# Patient Record
Sex: Female | Born: 1970 | Race: White | Hispanic: No | Marital: Married | Smoking: Current every day smoker
Health system: Southern US, Community
[De-identification: ages and names within clinical notes are randomized; demographics above are authoritative.]

## PROBLEM LIST (undated history)

## (undated) DIAGNOSIS — T4145XA Adverse effect of unspecified anesthetic, initial encounter: Secondary | ICD-10-CM

## (undated) DIAGNOSIS — G4730 Sleep apnea, unspecified: Secondary | ICD-10-CM

## (undated) DIAGNOSIS — Z87442 Personal history of urinary calculi: Secondary | ICD-10-CM

## (undated) DIAGNOSIS — F419 Anxiety disorder, unspecified: Secondary | ICD-10-CM

## (undated) DIAGNOSIS — T8859XA Other complications of anesthesia, initial encounter: Secondary | ICD-10-CM

## (undated) DIAGNOSIS — K219 Gastro-esophageal reflux disease without esophagitis: Secondary | ICD-10-CM

## (undated) DIAGNOSIS — F329 Major depressive disorder, single episode, unspecified: Secondary | ICD-10-CM

## (undated) DIAGNOSIS — M1990 Unspecified osteoarthritis, unspecified site: Secondary | ICD-10-CM

## (undated) DIAGNOSIS — F32A Depression, unspecified: Secondary | ICD-10-CM

## (undated) HISTORY — PX: TONSILLECTOMY: SUR1361

---

## 1995-09-10 HISTORY — PX: CHOLECYSTECTOMY: SHX55

## 1999-03-10 HISTORY — PX: TUBAL LIGATION: SHX77

## 2003-03-25 ENCOUNTER — Emergency Department (HOSPITAL_COMMUNITY): Admission: EM | Admit: 2003-03-25 | Discharge: 2003-03-25 | Payer: Self-pay | Admitting: Emergency Medicine

## 2011-06-10 HISTORY — PX: TOTAL VAGINAL HYSTERECTOMY: SHX2548

## 2016-07-19 ENCOUNTER — Encounter: Payer: Self-pay | Admitting: Sports Medicine

## 2016-07-19 ENCOUNTER — Ambulatory Visit (INDEPENDENT_AMBULATORY_CARE_PROVIDER_SITE_OTHER): Payer: BLUE CROSS/BLUE SHIELD | Admitting: Sports Medicine

## 2016-07-19 DIAGNOSIS — F32A Depression, unspecified: Secondary | ICD-10-CM | POA: Insufficient documentation

## 2016-07-19 DIAGNOSIS — L918 Other hypertrophic disorders of the skin: Secondary | ICD-10-CM | POA: Diagnosis not present

## 2016-07-19 DIAGNOSIS — E8940 Asymptomatic postprocedural ovarian failure: Secondary | ICD-10-CM

## 2016-07-19 DIAGNOSIS — Z0000 Encounter for general adult medical examination without abnormal findings: Secondary | ICD-10-CM | POA: Diagnosis not present

## 2016-07-19 DIAGNOSIS — R42 Dizziness and giddiness: Secondary | ICD-10-CM

## 2016-07-19 DIAGNOSIS — F419 Anxiety disorder, unspecified: Secondary | ICD-10-CM

## 2016-07-19 DIAGNOSIS — F418 Other specified anxiety disorders: Secondary | ICD-10-CM | POA: Diagnosis not present

## 2016-07-19 DIAGNOSIS — F329 Major depressive disorder, single episode, unspecified: Secondary | ICD-10-CM | POA: Insufficient documentation

## 2016-07-19 MED ORDER — SERTRALINE HCL 25 MG PO TABS: 25.0000 mg | ORAL_TABLET | Freq: Every day | ORAL | 2 refills | Status: DC

## 2016-07-19 NOTE — Assessment & Plan Note (Signed)
Doesn't needs Pap smears anymore. 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

## 2016-07-19 NOTE — Assessment & Plan Note (Signed)
Ordering routine blood work. 

## 2016-07-19 NOTE — Assessment & Plan Note (Signed)
Starting Zoloft 25, return in one month for a PHQ9 and GAD7.

## 2016-07-19 NOTE — Progress Notes (Signed)
  Subjective:    CC: Establish care.   HPI: This is a pleasant 45 year old female, she is related to the [place].    Mood disorder: For many years has had anxiety and depression, has been on an antidepressant in the past but not prolonged time, has severe depressed mood, difficulty sleeping, poor energy, overeating, difficulty concentrating, moderate anhedonia and mild guilt and psychomotor retardation without suicidal or homicidal ideation, in addition she has severe nervousness, difficulty controlling her worry, worrying about different things, difficulty relaxing, restlessness, irritability, and mild fear of impending doom.  Skin tag: Right axilla, would like this removed.  Dizziness: Gets occasional episodes where she'll have a feeling sensation running up from her legs. Agreeable to discuss this at a future visit.  Hot flashes: Is post total hysterectomy, performed for endometriosis, she does get significant and frequent vasomotor instability and hot flashes.  Past medical history:  Negative.  See flowsheet/record as well for more information.  Surgical history: Negative.  See flowsheet/record as well for more information.  Family history: Negative.  See flowsheet/record as well for more information.  Social history: Negative.  See flowsheet/record as well for more information.  Allergies, and medications have been entered into the medical record, reviewed, and no changes needed.    Review of Systems: No headache, visual changes, nausea, vomiting, diarrhea, constipation, dizziness, abdominal pain, skin rash, fevers, chills, night sweats, swollen lymph nodes, weight loss, chest pain, body aches, joint swelling, muscle aches, shortness of breath, mood changes, visual or auditory hallucinations.  Objective:    General: Well Developed, well nourished, and in no acute distress.  Neuro: Alert and oriented x3, extra-ocular muscles intact, sensation grossly intact. Cranial nerves II through  XII are intact, motor, sensory, and coordinative functions are all intact. HEENT: Normocephalic, atraumatic, pupils equal round reactive to light, neck supple, no masses, no lymphadenopathy, thyroid nonpalpable. Oropharynx, nasopharynx, external ear canals are unremarkable. Skin: Warm and dry, no rashes noted. Large skin tag in the right axilla Cardiac: Regular rate and rhythm, no murmurs rubs or gallops.  Respiratory: Clear to auscultation bilaterally. Not using accessory muscles, speaking in full sentences.  Abdominal: Soft, nontender, nondistended, positive bowel sounds, no masses, no organomegaly.  Musculoskeletal: Shoulder, elbow, wrist, hip, knee, ankle stable, and with full range of motion.  Procedure:  Removal of right axillary skin tag(s). Risks, benefits, alternatives explained to patient. Consent obtained. Time out conducted. Noted no overlying induration or erythema at site of injection. A small amount of lidocaine with epinephrine infiltrated under the skin tag(s) for local anesthesia. Hemostat used to clamp the neck of the skin tag(s). Scalpel then used to excise the skin tag(s), and subsequent electrocautery with a Hyfrecator used to control minor bleeding. Antibiotic ointment applied. Wound dressed. Advised to return if increased redness, swelling, drainage, fevers, or chills.  Impression and Recommendations:    The patient was counselled, risk factors were discussed, anticipatory guidance given.  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  Annual physical exam Ordering routine blood work.  Skin tag Surgical excision as above.  Anxiety and depression Starting Zoloft 25, return in one month for a PHQ9 and GAD7.  Dizziness, nonspecific We will discuss this in further detail at follow-up visit

## 2016-07-19 NOTE — Assessment & Plan Note (Signed)
We will discuss this in further detail at follow-up visit

## 2016-07-19 NOTE — Assessment & Plan Note (Signed)
Surgical excision as above.

## 2016-07-27 LAB — CBC
HCT: 43.9 % (ref 35.0–45.0)
Hemoglobin: 14.4 g/dL (ref 11.7–15.5)
MCH: 30.2 pg (ref 27.0–33.0)
MCHC: 32.8 g/dL (ref 32.0–36.0)
MCV: 92 fL (ref 80.0–100.0)
MPV: 10.1 fL (ref 7.5–12.5)
Platelets: 304 K/uL (ref 140–400)
RBC: 4.77 MIL/uL (ref 3.80–5.10)
RDW: 14 % (ref 11.0–15.0)
WBC: 10.3 K/uL (ref 3.8–10.8)

## 2016-07-27 LAB — COMPREHENSIVE METABOLIC PANEL
ALT: 15 U/L (ref 6–29)
Albumin: 4.2 g/dL (ref 3.6–5.1)
Alkaline Phosphatase: 48 U/L (ref 33–115)
BUN: 14 mg/dL (ref 7–25)
Chloride: 107 mmol/L (ref 98–110)
Creat: 0.78 mg/dL (ref 0.50–1.10)
Potassium: 4.4 mmol/L (ref 3.5–5.3)
Total Protein: 6.6 g/dL (ref 6.1–8.1)

## 2016-07-27 LAB — COMPREHENSIVE METABOLIC PANEL WITH GFR
AST: 14 U/L (ref 10–30)
CO2: 27 mmol/L (ref 20–31)
Calcium: 9.5 mg/dL (ref 8.6–10.2)
Glucose, Bld: 94 mg/dL (ref 65–99)
Sodium: 141 mmol/L (ref 135–146)
Total Bilirubin: 0.3 mg/dL (ref 0.2–1.2)

## 2016-07-27 LAB — LIPID PANEL
Cholesterol: 173 mg/dL (ref <200)
HDL: 37 mg/dL — ABNORMAL LOW (ref >50)
LDL Cholesterol: 114 mg/dL — ABNORMAL HIGH (ref <100)
Total CHOL/HDL Ratio: 4.7 Ratio (ref <5.0)
Triglycerides: 108 mg/dL (ref <150)
VLDL: 22 mg/dL (ref <30)

## 2016-07-27 LAB — HIV ANTIBODY (ROUTINE TESTING W REFLEX): HIV 1&2 Ab, 4th Generation: NONREACTIVE

## 2016-07-27 LAB — HEMOGLOBIN A1C
Hgb A1c MFr Bld: 5.2 % (ref <5.7)
Mean Plasma Glucose: 103 mg/dL

## 2016-07-27 LAB — TSH: TSH: 2.79 m[IU]/L

## 2016-07-27 LAB — VITAMIN D 25 HYDROXY (VIT D DEFICIENCY, FRACTURES): Vit D, 25-Hydroxy: 18 ng/mL — ABNORMAL LOW (ref 30–100)

## 2016-07-27 MED ORDER — VITAMIN D (ERGOCALCIFEROL) 1.25 MG (50000 UNIT) PO CAPS: 50000.0000 [IU] | ORAL_CAPSULE | ORAL | 0 refills | Status: DC

## 2016-07-27 NOTE — Addendum Note (Signed)
Addended by: Monica BectonHEKKEKANDAM, THOMAS J on: 07/27/2016 11:19 AM   Modules accepted: Orders

## 2016-08-16 ENCOUNTER — Ambulatory Visit (INDEPENDENT_AMBULATORY_CARE_PROVIDER_SITE_OTHER): Payer: BLUE CROSS/BLUE SHIELD | Admitting: Sports Medicine

## 2016-08-16 DIAGNOSIS — E6609 Other obesity due to excess calories: Secondary | ICD-10-CM

## 2016-08-16 DIAGNOSIS — F32A Depression, unspecified: Secondary | ICD-10-CM

## 2016-08-16 DIAGNOSIS — F418 Other specified anxiety disorders: Secondary | ICD-10-CM | POA: Diagnosis not present

## 2016-08-16 DIAGNOSIS — F419 Anxiety disorder, unspecified: Principal | ICD-10-CM

## 2016-08-16 DIAGNOSIS — F329 Major depressive disorder, single episode, unspecified: Secondary | ICD-10-CM

## 2016-08-16 DIAGNOSIS — R002 Palpitations: Secondary | ICD-10-CM | POA: Diagnosis not present

## 2016-08-16 DIAGNOSIS — E669 Obesity, unspecified: Secondary | ICD-10-CM | POA: Insufficient documentation

## 2016-08-16 MED ORDER — CITALOPRAM HYDROBROMIDE 10 MG PO TABS: 10.0000 mg | ORAL_TABLET | Freq: Every day | ORAL | 3 refills | Status: DC

## 2016-08-16 NOTE — Assessment & Plan Note (Signed)
Likely due to anxiety but we are going to proceed with Holter monitoring to rule out arrhythmia.

## 2016-08-16 NOTE — Progress Notes (Signed)
  Subjective:    CC: Follow-up  HPI: This is a pleasant 46 year old female, she comes in for follow-up of her anxiety and depression, has had some difficulty with tolerating 25 mg of Zoloft. Still has severe depressive and anxiety symptoms.  Obesity: Would like help losing weight.  Palpitations: Tells me that the palpitations occur first and then she becomes anxious, no chest pain, no presyncope. Has never had this worked up.  Past medical history:  Negative.  See flowsheet/record as well for more information.  Surgical history: Negative.  See flowsheet/record as well for more information.  Family history: Negative.  See flowsheet/record as well for more information.  Social history: Negative.  See flowsheet/record as well for more information.  Allergies, and medications have been entered into the medical record, reviewed, and no changes needed.   Review of Systems: No fevers, chills, night sweats, weight loss, chest pain, or shortness of breath.   Objective:    General: Well Developed, well nourished, and in no acute distress.  Neuro: Alert and oriented x3, extra-ocular muscles intact, sensation grossly intact.  HEENT: Normocephalic, atraumatic, pupils equal round reactive to light, neck supple, no masses, no lymphadenopathy, thyroid nonpalpable.  Skin: Warm and dry, no rashes. Cardiac: Regular rate and rhythm, no murmurs rubs or gallops, no lower extremity edema.  Respiratory: Clear to auscultation bilaterally. Not using accessory muscles, speaking in full sentences.  Impression and Recommendations:    Anxiety and depression Intolerant to Zoloft, switching to Celexa 10. Return in one month.  Palpitations Likely due to anxiety but we are going to proceed with Holter monitoring to rule out arrhythmia.  Obesity Patient will research weight loss medications and I'll prescribe whatever she wants.  I spent 25 minutes with this patient, greater than 50% was face-to-face time  counseling regarding the above diagnoses

## 2016-08-16 NOTE — Assessment & Plan Note (Signed)
Intolerant to Zoloft, switching to Celexa 10. Return in one month.

## 2016-08-16 NOTE — Assessment & Plan Note (Signed)
Patient will research weight loss medications and I'll prescribe whatever she wants.

## 2016-08-17 ENCOUNTER — Other Ambulatory Visit: Payer: Self-pay | Admitting: Sports Medicine

## 2016-08-17 DIAGNOSIS — R002 Palpitations: Secondary | ICD-10-CM

## 2016-08-23 ENCOUNTER — Ambulatory Visit (INDEPENDENT_AMBULATORY_CARE_PROVIDER_SITE_OTHER): Payer: BLUE CROSS/BLUE SHIELD

## 2016-08-23 DIAGNOSIS — R002 Palpitations: Secondary | ICD-10-CM | POA: Diagnosis not present

## 2016-09-13 ENCOUNTER — Encounter: Payer: Self-pay | Admitting: Sports Medicine

## 2016-09-13 ENCOUNTER — Ambulatory Visit (INDEPENDENT_AMBULATORY_CARE_PROVIDER_SITE_OTHER): Payer: BLUE CROSS/BLUE SHIELD | Admitting: Sports Medicine

## 2016-09-13 DIAGNOSIS — F418 Other specified anxiety disorders: Secondary | ICD-10-CM | POA: Diagnosis not present

## 2016-09-13 DIAGNOSIS — E6609 Other obesity due to excess calories: Secondary | ICD-10-CM | POA: Diagnosis not present

## 2016-09-13 DIAGNOSIS — F32A Depression, unspecified: Secondary | ICD-10-CM

## 2016-09-13 DIAGNOSIS — R002 Palpitations: Secondary | ICD-10-CM

## 2016-09-13 DIAGNOSIS — F419 Anxiety disorder, unspecified: Secondary | ICD-10-CM

## 2016-09-13 DIAGNOSIS — F329 Major depressive disorder, single episode, unspecified: Secondary | ICD-10-CM

## 2016-09-13 MED ORDER — PHENTERMINE HCL 37.5 MG PO TABS: ORAL_TABLET | ORAL | 0 refills | Status: DC

## 2016-09-13 MED ORDER — DOCUSATE SODIUM 100 MG PO CAPS: 100.0000 mg | ORAL_CAPSULE | Freq: Two times a day (BID) | ORAL | 3 refills | Status: DC

## 2016-09-13 MED ORDER — CITALOPRAM HYDROBROMIDE 20 MG PO TABS: 20.0000 mg | ORAL_TABLET | Freq: Every day | ORAL | 3 refills | Status: DC

## 2016-09-13 NOTE — Assessment & Plan Note (Signed)
Holter negative, rare PACs and PVCs that were asymptomatic.

## 2016-09-13 NOTE — Progress Notes (Signed)
  Subjective:    CC: Follow-up  HPI: Depression and anxiety: Good tolerance and good improvement with Celexa 10, only now with moderate poor energy, overeating, mild difficulty sleeping and difficulty concentrating. No suicidal or homicidal ideation. Still has severe nervousness, moderate or anybody different things, trouble relaxing, irritability and mild restlessness and difficulty controlling her worry.  Palpitations: Finished Holter monitoring, she did have a few PACs and PVCs that were asymptomatic.  Obesity: Desires to restart weight medication now that Holter monitor test is negative.  Past medical history:  Negative.  See flowsheet/record as well for more information.  Surgical history: Negative.  See flowsheet/record as well for more information.  Family history: Negative.  See flowsheet/record as well for more information.  Social history: Negative.  See flowsheet/record as well for more information.  Allergies, and medications have been entered into the medical record, reviewed, and no changes needed.   Review of Systems: No fevers, chills, night sweats, weight loss, chest pain, or shortness of breath.   Objective:    General: Well Developed, well nourished, and in no acute distress.  Neuro: Alert and oriented x3, extra-ocular muscles intact, sensation grossly intact.  HEENT: Normocephalic, atraumatic, pupils equal round reactive to light, neck supple, no masses, no lymphadenopathy, thyroid nonpalpable.  Skin: Warm and dry, no rashes. Cardiac: Regular rate and rhythm, no murmurs rubs or gallops, no lower extremity edema.  Respiratory: Clear to auscultation bilaterally. Not using accessory muscles, speaking in full sentences.  Impression and Recommendations:    Anxiety and depression Improvement with Celexa 10. Increasing to 20mg  . Return in 1 month for PHQ9 and GAD7  Obesity Restarting phentermine. Normal holter monitor. Colace to prevent  constipation  Palpitations Holter negative, rare PACs and PVCs that were asymptomatic.

## 2016-09-13 NOTE — Assessment & Plan Note (Signed)
Restarting phentermine. Normal holter monitor. Colace to prevent constipation

## 2016-09-13 NOTE — Assessment & Plan Note (Signed)
Improvement with Celexa 10. Increasing to 20mg  . Return in 1 month for PHQ9 and GAD7

## 2016-09-16 ENCOUNTER — Other Ambulatory Visit: Payer: Self-pay | Admitting: Sports Medicine

## 2016-09-17 ENCOUNTER — Telehealth: Payer: Self-pay

## 2016-09-17 NOTE — Telephone Encounter (Signed)
Pt states multiple people in the home have been diagnosed with flu and would like to know if they can get prophylactic treatment. Please assist.

## 2016-09-17 NOTE — Telephone Encounter (Signed)
Decided to wait on Tamiflu.

## 2016-09-17 NOTE — Telephone Encounter (Signed)
Yes they can.  But I would need names and DOBs and pharmacy of choice.

## 2016-10-11 ENCOUNTER — Ambulatory Visit (INDEPENDENT_AMBULATORY_CARE_PROVIDER_SITE_OTHER): Payer: BLUE CROSS/BLUE SHIELD | Admitting: Sports Medicine

## 2016-10-11 DIAGNOSIS — F32A Depression, unspecified: Secondary | ICD-10-CM

## 2016-10-11 DIAGNOSIS — F329 Major depressive disorder, single episode, unspecified: Secondary | ICD-10-CM

## 2016-10-11 DIAGNOSIS — E6609 Other obesity due to excess calories: Secondary | ICD-10-CM

## 2016-10-11 DIAGNOSIS — F418 Other specified anxiety disorders: Secondary | ICD-10-CM | POA: Diagnosis not present

## 2016-10-11 DIAGNOSIS — F419 Anxiety disorder, unspecified: Principal | ICD-10-CM

## 2016-10-11 DIAGNOSIS — E8940 Asymptomatic postprocedural ovarian failure: Secondary | ICD-10-CM | POA: Diagnosis not present

## 2016-10-11 MED ORDER — CITALOPRAM HYDROBROMIDE 40 MG PO TABS: 40.0000 mg | ORAL_TABLET | Freq: Every day | ORAL | 3 refills | Status: DC

## 2016-10-11 MED ORDER — PHENTERMINE HCL 37.5 MG PO TABS: ORAL_TABLET | ORAL | 0 refills | Status: DC

## 2016-10-11 NOTE — Assessment & Plan Note (Signed)
10 pound weight loss after the first month on phentermine, refilling medication. Return in one month

## 2016-10-11 NOTE — Assessment & Plan Note (Signed)
Continued fantastic improvements in mood with increasing to 20 Celexa. Continues to have hot flashes so I am going to increase to 40 mg of Celexa.

## 2016-10-11 NOTE — Progress Notes (Signed)
  Subjective:    CC: Follow-up  HPI: Depression and anxiety: Continues to improve with Celexa 20 mg. No suicidal or homicidal ideation  Obesity: 10 pound weight loss after the first month on phentermine.  Vasomotor instability: Postmenopausal, has not noticed much improvement yet going to 20 mg of Celexa, agreeable to go up on the dose before we consider hormone replacement.  Past medical history:  Negative.  See flowsheet/record as well for more information.  Surgical history: Negative.  See flowsheet/record as well for more information.  Family history: Negative.  See flowsheet/record as well for more information.  Social history: Negative.  See flowsheet/record as well for more information.  Allergies, and medications have been entered into the medical record, reviewed, and no changes needed.   Review of Systems: No fevers, chills, night sweats, weight loss, chest pain, or shortness of breath.   Objective:    General: Well Developed, well nourished, and in no acute distress.  Neuro: Alert and oriented x3, extra-ocular muscles intact, sensation grossly intact.  HEENT: Normocephalic, atraumatic, pupils equal round reactive to light, neck supple, no masses, no lymphadenopathy, thyroid nonpalpable.  Skin: Warm and dry, no rashes. Cardiac: Regular rate and rhythm, no murmurs rubs or gallops, no lower extremity edema.  Respiratory: Clear to auscultation bilaterally. Not using accessory muscles, speaking in full sentences.  Impression and Recommendations:    Anxiety and depression Continued fantastic improvements in mood with increasing to 20 Celexa. Continues to have hot flashes so I am going to increase to 40 mg of Celexa.  Obesity 10 pound weight loss after the first month on phentermine, refilling medication. Return in one month  Surgical menopause Still having severe hot flashes, we are going to increase Celexa to 40 mg before considering hormone replacement therapy. Patient  is post hysterectomy.  I spent 25 minutes with this patient, greater than 50% was face-to-face time counseling regarding the above diagnoses

## 2016-10-11 NOTE — Assessment & Plan Note (Signed)
Still having severe hot flashes, we are going to increase Celexa to 40 mg before considering hormone replacement therapy. Patient is post hysterectomy.

## 2016-10-21 ENCOUNTER — Telehealth: Payer: Self-pay | Admitting: Sports Medicine

## 2016-10-21 MED ORDER — AMOXICILLIN-POT CLAVULANATE 875-125 MG PO TABS: 1.0000 | ORAL_TABLET | Freq: Two times a day (BID) | ORAL | 0 refills | Status: AC

## 2016-10-21 NOTE — Telephone Encounter (Signed)
Pt advised.

## 2016-10-21 NOTE — Telephone Encounter (Signed)
Augmentin sent in.  

## 2016-10-21 NOTE — Telephone Encounter (Signed)
Pt called clinic requesting an Rx for an antibiotic and pain. Pt states she is on vacation in [place] and has an abscessed tooth. Request something to help her until she gets back into town and can be seen.   Closest pharmacy: [facility] [address], [place], [place] [id number]

## 2016-11-01 ENCOUNTER — Encounter: Payer: Self-pay | Admitting: Sports Medicine

## 2016-11-08 ENCOUNTER — Ambulatory Visit (INDEPENDENT_AMBULATORY_CARE_PROVIDER_SITE_OTHER): Payer: BLUE CROSS/BLUE SHIELD | Admitting: Sports Medicine

## 2016-11-08 ENCOUNTER — Encounter: Payer: Self-pay | Admitting: Sports Medicine

## 2016-11-08 DIAGNOSIS — F17200 Nicotine dependence, unspecified, uncomplicated: Secondary | ICD-10-CM

## 2016-11-08 DIAGNOSIS — E6609 Other obesity due to excess calories: Secondary | ICD-10-CM

## 2016-11-08 MED ORDER — PHENTERMINE HCL 37.5 MG PO TABS: ORAL_TABLET | ORAL | 0 refills | Status: DC

## 2016-11-08 MED ORDER — VARENICLINE TARTRATE 1 MG PO TABS: 1.0000 mg | ORAL_TABLET | Freq: Two times a day (BID) | ORAL | 3 refills | Status: DC

## 2016-11-08 MED ORDER — VARENICLINE TARTRATE 0.5 MG X 11 & 1 MG X 42 PO MISC: ORAL | 0 refills | Status: DC

## 2016-11-08 NOTE — Progress Notes (Signed)
  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 She has been on Colace and this is improved. Would like to try an additional month on phentermine before considering bariatric surgery.  Smoker: Would like to start Chantix.  Past medical history:  Negative.  See flowsheet/record as well for more information.  Surgical history: Negative.  See flowsheet/record as well for more information.  Family history: Negative.  See flowsheet/record as well for more information.  Social history: Negative.  See flowsheet/record as well for more information.  Allergies, and medications have been entered into the medical record, reviewed, and no changes needed.   Review of Systems: No fevers, chills, night sweats, weight loss, chest pain, or shortness of breath.   Objective:    General: Well Developed, well nourished, and in no acute distress.  Neuro: Alert and oriented x3, extra-ocular muscles intact, sensation grossly intact.  HEENT: Normocephalic, atraumatic, pupils equal round reactive to light, neck supple, no masses, no lymphadenopathy, thyroid nonpalpable.  Skin: Warm and dry, no rashes. Cardiac: Regular rate and rhythm, no murmurs rubs or gallops, no lower extremity edema.  Respiratory: Clear to auscultation bilaterally. Not using accessory muscles, speaking in full sentences.  Impression and Recommendations:    Obesity Stopped medications due to an anal fissure, discussed using Colace to keep stool soft, I'm going to refill the medication as we enter the third month, but she understands if she does not respond this month then we will proceed with bariatric surgery.  Smoker Starting Chantix  I spent 25 minutes with this patient, greater than 50% was face-to-face time counseling regarding the above diagnoses

## 2016-11-08 NOTE — Assessment & Plan Note (Signed)
Stopped medications due to an anal fissure, discussed using Colace to keep stool soft, I'm going to refill the medication as we enter the third month, but she understands if she does not respond this month then we will proceed with bariatric surgery.

## 2016-11-08 NOTE — Assessment & Plan Note (Signed)
Starting Chantix. 

## 2016-11-10 ENCOUNTER — Other Ambulatory Visit: Payer: Self-pay | Admitting: Sports Medicine

## 2016-11-10 DIAGNOSIS — F419 Anxiety disorder, unspecified: Principal | ICD-10-CM

## 2016-11-10 DIAGNOSIS — F32A Depression, unspecified: Secondary | ICD-10-CM

## 2016-11-10 DIAGNOSIS — F329 Major depressive disorder, single episode, unspecified: Secondary | ICD-10-CM

## 2016-11-11 ENCOUNTER — Encounter: Payer: Self-pay | Admitting: Sports Medicine

## 2016-11-15 ENCOUNTER — Encounter: Payer: Self-pay | Admitting: Sports Medicine

## 2016-11-17 ENCOUNTER — Encounter: Payer: Self-pay | Admitting: Sports Medicine

## 2016-11-29 ENCOUNTER — Other Ambulatory Visit (HOSPITAL_COMMUNITY): Payer: Self-pay | Admitting: Surgery

## 2016-12-06 ENCOUNTER — Ambulatory Visit (INDEPENDENT_AMBULATORY_CARE_PROVIDER_SITE_OTHER): Payer: BLUE CROSS/BLUE SHIELD | Admitting: Sports Medicine

## 2016-12-06 ENCOUNTER — Encounter: Payer: Self-pay | Admitting: Sports Medicine

## 2016-12-06 DIAGNOSIS — L03039 Cellulitis of unspecified toe: Secondary | ICD-10-CM | POA: Diagnosis not present

## 2016-12-06 DIAGNOSIS — E6609 Other obesity due to excess calories: Secondary | ICD-10-CM | POA: Diagnosis not present

## 2016-12-06 MED ORDER — DOXYCYCLINE HYCLATE 100 MG PO TABS: 100.0000 mg | ORAL_TABLET | Freq: Two times a day (BID) | ORAL | 0 refills | Status: AC

## 2016-12-06 MED ORDER — TERBINAFINE HCL 250 MG PO TABS: 250.0000 mg | ORAL_TABLET | Freq: Every day | ORAL | 1 refills | Status: AC

## 2016-12-06 NOTE — Assessment & Plan Note (Signed)
Could not tolerate phentermine due to anal fissure, she is going to proceed with bariatric surgery.

## 2016-12-06 NOTE — Progress Notes (Signed)
  Subjective:    CC: Follow-up  HPI: Obesity: She is now set up with bariatric surgery. Continues to not be able to tolerate phentermine due to anal fissures.  Toe pain: Left great toe, history of ingrown toenails and thickening of the nails. Has never been treated for onychomycosis. Pain is moderate, persistent, localized without radiation.  Past medical history:  Negative.  See flowsheet/record as well for more information.  Surgical history: Negative.  See flowsheet/record as well for more information.  Family history: Negative.  See flowsheet/record as well for more information.  Social history: Negative.  See flowsheet/record as well for more information.  Allergies, and medications have been entered into the medical record, reviewed, and no changes needed.   Review of Systems: No fevers, chills, night sweats, weight loss, chest pain, or shortness of breath.   Objective:    General: Well Developed, well nourished, and in no acute distress.  Neuro: Alert and oriented x3, extra-ocular muscles intact, sensation grossly intact.  HEENT: Normocephalic, atraumatic, pupils equal round reactive to light, neck supple, no masses, no lymphadenopathy, thyroid nonpalpable.  Skin: Warm and dry, no rashes. Cardiac: Regular rate and rhythm, no murmurs rubs or gallops, no lower extremity edema.  Respiratory: Clear to auscultation bilaterally. Not using accessory muscles, speaking in full sentences. Left foot: Visibly erythematous and swollen great toe around the nail with onychodystrophy and yellowing. No visible ingrown toenail. Classic paronychia.  Impression and Recommendations:    Obesity Could not tolerate phentermine due to anal fissure, she is going to proceed with bariatric surgery.  Paronychia of toe Left and right great toenails, with what appears to be onychomycosis with onychodystrophy. Adding doxycycline, Lamisil. Return in one month for this. I told her we will try to get this  under control with months of Lamisil treatment before considering nail plate excision.  I spent 25 minutes with this patient, greater than 50% was face-to-face time counseling regarding the above diagnoses

## 2016-12-06 NOTE — Assessment & Plan Note (Signed)
Left and right great toenails, with what appears to be onychomycosis with onychodystrophy. Adding doxycycline, Lamisil. Return in one month for this. I told her we will try to get this under control with months of Lamisil treatment before considering nail plate excision.

## 2016-12-09 ENCOUNTER — Other Ambulatory Visit: Payer: Self-pay | Admitting: Sports Medicine

## 2016-12-09 ENCOUNTER — Encounter: Payer: Self-pay | Admitting: Sports Medicine

## 2016-12-09 DIAGNOSIS — F329 Major depressive disorder, single episode, unspecified: Secondary | ICD-10-CM

## 2016-12-09 DIAGNOSIS — F419 Anxiety disorder, unspecified: Principal | ICD-10-CM

## 2016-12-13 ENCOUNTER — Ambulatory Visit (HOSPITAL_COMMUNITY)
Admission: RE | Admit: 2016-12-13 | Discharge: 2016-12-13 | Disposition: A | Discharge status: Home / Self Care | Payer: BLUE CROSS/BLUE SHIELD | Source: Ambulatory Visit | Attending: Surgery | Admitting: Surgery

## 2016-12-13 ENCOUNTER — Encounter (HOSPITAL_COMMUNITY): Payer: Self-pay | Admitting: Radiology

## 2016-12-13 ENCOUNTER — Other Ambulatory Visit: Payer: Self-pay

## 2016-12-13 DIAGNOSIS — Z6837 Body mass index (BMI) 37.0-37.9, adult: Secondary | ICD-10-CM | POA: Diagnosis not present

## 2016-12-13 DIAGNOSIS — R9431 Abnormal electrocardiogram [ECG] [EKG]: Secondary | ICD-10-CM | POA: Insufficient documentation

## 2016-12-20 ENCOUNTER — Encounter: Payer: BLUE CROSS/BLUE SHIELD | Attending: Surgery | Admitting: Registered"

## 2016-12-20 ENCOUNTER — Encounter: Payer: Self-pay | Admitting: Registered"

## 2016-12-20 DIAGNOSIS — Z713 Dietary counseling and surveillance: Secondary | ICD-10-CM | POA: Insufficient documentation

## 2016-12-20 DIAGNOSIS — E669 Obesity, unspecified: Secondary | ICD-10-CM

## 2016-12-20 NOTE — Progress Notes (Signed)
Pre-Op Assessment Visit:  Pre-Operative Sleeve Gastrectomy Surgery  Medical Nutrition Therapy:  Appt start time: 11:19  End time:  12:00  Patient was seen on 12/20/2016 for Pre-Operative Nutrition Assessment. Assessment and letter of approval faxed to [facility] Bariatric [facility] Program coordinator on 12/20/2016.   Pt expectation of surgery: be healthy, improve aching joints, sleep apea  Pt expectation of Dietitian: learn how to eat better  Start weight at [facility]: 209.6 BMI: 38.65   Pt states she has gained 3 lbs from last dr visit and attributes it to recently quitting 20+ year habit of smoking.  Pt reports she drinks 3 cans of diet mountain dew a day and thinks it is her security blanket. Pt states she is willing to reduce consumption prior to next visit.   Pt states she needs 6 months of SWL visits with [place] prior to surgery.    24 hr Dietary Recall: First Meal: sausage biscuit Snack: nabs Second Meal: lean cuisine or sandwich Snack: sometimes fruit Third Meal: pork/chicken, green beans, corn, salad, brown rice, mac and cheese Snack: none Beverages: diet mountain dew, water (sometimes)  Encouraged to engage in 150 minutes of moderate physical activity including cardiovascular and weight baring weekly  Handouts given during visit include:  . Pre-Op Goals . Bariatric Surgery Protein Shakes . Vitamin and Mineral Recommendations   During the appointment today the following Pre-Op Goals were reviewed with the patient: . Maintain or lose weight as instructed by your surgeon . Make healthy food choices . Begin to limit portion sizes . Limited concentrated sugars and fried foods . Keep fat/sugar in the single digits per serving on          food labels . Practice CHEWING your food  (aim for 30 chews per bite or until applesauce consistency) . Practice not drinking 15 minutes before, during, and 30 minutes after each meal/snack . Avoid all carbonated beverages   . Avoid/limit caffeinated beverages  . Avoid all sugar-sweetened beverages . Consume 3 meals per day; eat every 3-5 hours . Make a list of non-food related activities . Aim for 64-100 ounces of FLUID daily  . Aim for at least 60-80 grams of PROTEIN daily . Look for a liquid protein source that contain ?15 g protein and ?5 g carbohydrate  (ex: shakes, drinks, shots)  -Follow diet recommendations listed below   Energy and Macronutrient Recomendations: Calories: 1600 Carbohydrate: 180 Protein: 120 Fat: 44  Demonstrated degree of understanding via:  Teach Back   Teaching Method Utilized:  Visual Auditory Hands on  Barriers to learning/adherence to lifestyle change: none  Patient to call the [facility] to enroll in Pre-Op and Post-Op Nutrition Education when surgery date is scheduled.

## 2016-12-29 ENCOUNTER — Encounter: Payer: Self-pay | Admitting: Sports Medicine

## 2016-12-29 MED ORDER — SULFAMETHOXAZOLE-TRIMETHOPRIM 800-160 MG PO TABS: 1.0000 | ORAL_TABLET | Freq: Two times a day (BID) | ORAL | 0 refills | Status: DC

## 2016-12-30 ENCOUNTER — Other Ambulatory Visit: Payer: Self-pay

## 2016-12-30 ENCOUNTER — Encounter: Payer: Self-pay | Admitting: Sports Medicine

## 2016-12-30 MED ORDER — SULFAMETHOXAZOLE-TRIMETHOPRIM 800-160 MG PO TABS: 1.0000 | ORAL_TABLET | Freq: Two times a day (BID) | ORAL | 0 refills | Status: DC

## 2016-12-30 NOTE — Telephone Encounter (Signed)
Spoke with Pt, Rx was sent to incorrect pharmacy. This has been corrected. No further questions.

## 2017-01-04 ENCOUNTER — Ambulatory Visit: Payer: BLUE CROSS/BLUE SHIELD | Admitting: Sports Medicine

## 2017-01-06 ENCOUNTER — Encounter: Payer: Self-pay | Admitting: Sports Medicine

## 2017-01-06 NOTE — Telephone Encounter (Signed)
Called [facility] and called Pt. Rx will be ready for pick up today.

## 2017-01-10 ENCOUNTER — Encounter: Payer: BLUE CROSS/BLUE SHIELD | Attending: Surgery | Admitting: Skilled Nursing Facility1

## 2017-01-10 DIAGNOSIS — E6609 Other obesity due to excess calories: Secondary | ICD-10-CM

## 2017-01-10 DIAGNOSIS — Z713 Dietary counseling and surveillance: Secondary | ICD-10-CM | POA: Insufficient documentation

## 2017-01-11 ENCOUNTER — Encounter: Payer: Self-pay | Admitting: Skilled Nursing Facility1

## 2017-01-11 NOTE — Progress Notes (Signed)
  Pre-Operative Nutrition Class:  Appt start time: 5909   End time:  1830.  Patient was seen on 01/11/2017 for Pre-Operative Bariatric Surgery Education at [facility].   Surgery date:  Surgery type:  Start weight at [facility]: 209.6 Weight today: 214.7  TANITA  BODY COMP RESULTS     BMI (kg/m^2)    Fat Mass (lbs)    Fat Free Mass (lbs)    Total Body Water (lbs)    Samples given per MNT protocol. Patient educated on appropriate usage: Opurity Multivitamin Lot # [id number] Exp: 04/19  celebrate Calcium Citrate Lot # [id number] Exp: 11/2017  premierProtein shake Lot # [id number] Exp: 27/nov/2018  The following the learning objectives were met by the patient during this course:  Identify Pre-Op Dietary Goals and will begin 2 weeks pre-operatively  Identify appropriate sources of fluids and proteins   State protein recommendations and appropriate sources pre and post-operatively  Identify Post-Operative Dietary Goals and will follow for 2 weeks post-operatively  Identify appropriate multivitamin and calcium sources  Describe the need for physical activity post-operatively and will follow MD recommendations  State when to call healthcare provider regarding medication questions or post-operative complications  Handouts given during class include:  Pre-Op Bariatric Surgery Diet Handout  Protein Shake Handout  Post-Op Bariatric Surgery Nutrition Handout  [facility] Program Information Flyer  Support Group Information Flyer  [facility] Bariatric Supplements Price List  Follow-Up Plan: Patient will follow-up at [facility] 2 weeks post operatively for diet advancement per MD.

## 2017-01-17 ENCOUNTER — Ambulatory Visit: Payer: BLUE CROSS/BLUE SHIELD | Admitting: Registered"

## 2017-01-26 NOTE — Progress Notes (Signed)
Please place orders in EPIC as patient is being scheduled for a pre-op appointment! Thank you! 

## 2017-01-27 NOTE — Patient Instructions (Addendum)
Haynes BastJennifer Elaine Weseman  01/31/2017   Your procedure is scheduled on: 02-08-17  Report to [facility]  [facility]  [facility]vators to 3rd floor to  Short Stay Center at 8:45AM.    Call this number if you have problems the morning of surgery [phone]   Remember: ONLY 1 PERSON MAY GO WITH YOU TO SHORT STAY TO GET  READY MORNING OF YOUR SURGERY.  Do not eat food or drink liquids :After Midnight.     Take these medicines the morning of surgery with A SIP OF WATER: citalopram(celexa)                                 You may not have any metal on your body including hair pins and              piercings  Do not wear jewelry, make-up, lotions, powders or perfumes, deodorant             Do not wear nail polish.  Do not shave  48 hours prior to surgery.                 Do not bring valuables to the hospital. [facility] IS NOT             RESPONSIBLE   FOR VALUABLES.  Contacts, dentures or bridgework may not be worn into surgery.  Leave suitcase in the car. After surgery it may be brought to your room.               Please read over the following fact sheets you were given: _____________________________________________________________________        [facility] Pre[facility] Before surgery, you can play an important role.  Because skin is not sterile, your skin needs to be as free of germs as possible.  You can reduce the number of germs on your skin by washing with CHG (chlorahexidine gluconate) soap before surgery.  CHG is an antiseptic cleaner which kills germs and bonds with the skin to continue killing germs even after washing. Please DO NOT use if you have an allergy to CHG or antibacterial soaps.  If your skin becomes reddened/irritated stop using the CHG and inform your nurse when you arrive at Short Stay. Do not shave (including legs and underarms) for at least 48 hours prior to the first CHG shower.  You may shave your  face/neck. Please follow these instructions carefully:  1.  Shower with CHG Soap the night before surgery and the  morning of Surgery.  2.  If you choose to wash your hair, wash your hair first as usual with your  normal  shampoo.  3.  After you shampoo, rinse your hair and body thoroughly to remove the  shampoo.                           4.  Use CHG as you would any other liquid soap.  You can apply chg directly  to the skin and wash                       Gently with a scrungie or clean washcloth.  5.  Apply the CHG Soap to your body ONLY FROM THE NECK DOWN.  Do not use on face/ open                           Wound or open sores. Avoid contact with eyes, ears mouth and genitals (private parts).                       Wash face,  Genitals (private parts) with your normal soap.             6.  Wash thoroughly, paying special attention to the area where your surgery  will be performed.  7.  Thoroughly rinse your body with warm water from the neck down.  8.  DO NOT shower/wash with your normal soap after using and rinsing off  the CHG Soap.                9.  Pat yourself dry with a clean towel.            10.  Wear clean pajamas.            11.  Place clean sheets on your bed the night of your first shower and do not  sleep with pets. Day of Surgery : Do not apply any lotions/deodorants the morning of surgery.  Please wear clean clothes to the hospital/surgery center.  FAILURE TO FOLLOW THESE INSTRUCTIONS MAY RESULT IN THE CANCELLATION OF YOUR SURGERY PATIENT SIGNATURE_________________________________  NURSE SIGNATURE__________________________________  ________________________________________________________________________   Adam Phenix  An incentive spirometer is a tool that can help keep your lungs clear and active. This tool measures how well you are filling your lungs with each breath. Taking long deep breaths may help reverse or decrease the chance of developing breathing  (pulmonary) problems (especially infection) following:  A long period of time when you are unable to move or be active. BEFORE THE PROCEDURE   If the spirometer includes an indicator to show your best effort, your nurse or respiratory therapist will set it to a desired goal.  If possible, sit up straight or lean slightly forward. Try not to slouch.  Hold the incentive spirometer in an upright position. INSTRUCTIONS FOR USE  1. Sit on the edge of your bed if possible, or sit up as far as you can in bed or on a chair. 2. Hold the incentive spirometer in an upright position. 3. Breathe out normally. 4. Place the mouthpiece in your mouth and seal your lips tightly around it. 5. Breathe in slowly and as deeply as possible, raising the piston or the ball toward the top of the column. 6. Hold your breath for 3-5 seconds or for as long as possible. Allow the piston or ball to fall to the bottom of the column. 7. Remove the mouthpiece from your mouth and breathe out normally. 8. Rest for a few seconds and repeat Steps 1 through 7 at least 10 times every 1-2 hours when you are awake. Take your time and take a few normal breaths between deep breaths. 9. The spirometer may include an indicator to show your best effort. Use the indicator as a goal to work toward during each repetition. 10. After each set of 10 deep breaths, practice coughing to be sure your lungs are clear. If you have an incision (the cut made at the time of surgery), support your incision when coughing by placing a pillow or rolled up towels firmly against it. Once you are able to get out of  bed, walk around indoors and cough well. You may stop using the incentive spirometer when instructed by your caregiver.  RISKS AND COMPLICATIONS  Take your time so you do not get dizzy or light-headed.  If you are in pain, you may need to take or ask for pain medication before doing incentive spirometry. It is harder to take a deep breath if you  are having pain. AFTER USE  Rest and breathe slowly and easily.  It can be helpful to keep track of a log of your progress. Your caregiver can provide you with a simple table to help with this. If you are using the spirometer at home, follow these instructions: [place] IF:   You are having difficultly using the spirometer.  You have trouble using the spirometer as often as instructed.  Your pain medication is not giving enough relief while using the spirometer.  You develop fever of 100.5 F (38.1 C) or higher. SEEK IMMEDIATE MEDICAL CARE IF:   You cough up bloody sputum that had not been present before.  You develop fever of 102 F (38.9 C) or greater.  You develop worsening pain at or near the incision site. MAKE SURE YOU:   Understand these instructions.  Will watch your condition.  Will get help right away if you are not doing well or get worse. Document Released: 12/06/2006 Document Revised: 10/18/2011 Document Reviewed: 02/06/2007 [facility] Patient Information 2014 [place], [place].   ________________________________________________________________________

## 2017-01-31 NOTE — Progress Notes (Signed)
EKG 12-13-16 epic  CXR 12-13-16 epic

## 2017-02-02 ENCOUNTER — Encounter (HOSPITAL_COMMUNITY)
Admission: RE | Admit: 2017-02-02 | Discharge: 2017-02-02 | Disposition: A | Discharge status: Home / Self Care | Payer: BLUE CROSS/BLUE SHIELD | Source: Ambulatory Visit | Attending: Surgery | Admitting: Surgery

## 2017-02-02 ENCOUNTER — Encounter (HOSPITAL_COMMUNITY): Payer: Self-pay

## 2017-02-02 DIAGNOSIS — G4730 Sleep apnea, unspecified: Secondary | ICD-10-CM | POA: Diagnosis not present

## 2017-02-02 DIAGNOSIS — E8940 Asymptomatic postprocedural ovarian failure: Secondary | ICD-10-CM | POA: Insufficient documentation

## 2017-02-02 DIAGNOSIS — Z01812 Encounter for preprocedural laboratory examination: Secondary | ICD-10-CM | POA: Diagnosis not present

## 2017-02-02 DIAGNOSIS — F329 Major depressive disorder, single episode, unspecified: Secondary | ICD-10-CM | POA: Insufficient documentation

## 2017-02-02 DIAGNOSIS — F419 Anxiety disorder, unspecified: Secondary | ICD-10-CM | POA: Insufficient documentation

## 2017-02-02 DIAGNOSIS — R002 Palpitations: Secondary | ICD-10-CM | POA: Insufficient documentation

## 2017-02-02 DIAGNOSIS — L03039 Cellulitis of unspecified toe: Secondary | ICD-10-CM | POA: Diagnosis not present

## 2017-02-02 DIAGNOSIS — E669 Obesity, unspecified: Secondary | ICD-10-CM | POA: Diagnosis not present

## 2017-02-02 DIAGNOSIS — K219 Gastro-esophageal reflux disease without esophagitis: Secondary | ICD-10-CM | POA: Diagnosis not present

## 2017-02-02 DIAGNOSIS — F17200 Nicotine dependence, unspecified, uncomplicated: Secondary | ICD-10-CM | POA: Diagnosis not present

## 2017-02-02 HISTORY — DX: Unspecified osteoarthritis, unspecified site: M19.90

## 2017-02-02 HISTORY — DX: Anxiety disorder, unspecified: F41.9

## 2017-02-02 HISTORY — DX: Major depressive disorder, single episode, unspecified: F32.9

## 2017-02-02 HISTORY — DX: Other complications of anesthesia, initial encounter: T88.59XA

## 2017-02-02 HISTORY — DX: Sleep apnea, unspecified: G47.30

## 2017-02-02 HISTORY — DX: Gastro-esophageal reflux disease without esophagitis: K21.9

## 2017-02-02 HISTORY — DX: Personal history of urinary calculi: Z87.442

## 2017-02-02 HISTORY — DX: Adverse effect of unspecified anesthetic, initial encounter: T41.45XA

## 2017-02-02 HISTORY — DX: Depression, unspecified: F32.A

## 2017-02-02 LAB — CBC
HEMATOCRIT: 44.1 % (ref 36.0–46.0)
HEMOGLOBIN: 15 g/dL (ref 12.0–15.0)
MCH: 29.7 pg (ref 26.0–34.0)
MCHC: 34 g/dL (ref 30.0–36.0)
MCV: 87.3 fL (ref 78.0–100.0)
Platelets: 376 10*3/uL (ref 150–400)
RBC: 5.05 MIL/uL (ref 3.87–5.11)
RDW: 13.4 % (ref 11.5–15.5)
WBC: 10.7 10*3/uL — ABNORMAL HIGH (ref 4.0–10.5)

## 2017-02-04 ENCOUNTER — Ambulatory Visit: Payer: Self-pay | Admitting: Surgery

## 2017-02-04 NOTE — H&P (Signed)
Rita GarnetJennifer Bentley 11/26/2016 2:51 PM Location: [facility] Patient #: [id number] DOB: 08/01/1971 Married / Language: English / Race: White Female   History of Present Illness Rita Bentley(Rita Bentley B. Rita Bentley; 11/26/2016 3:26 PM) The patient is a 46 year old female who presents for a bariatric surgery evaluation. Rita Bentley has reviewed our online seminar and her sister recently had a gastric bypass in [place]. She has been obese her entire adult life and has had mixed success with Phentermine (twice attempts), Weight watchers (4x), Slim Fast at least 5 times. 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 She has recently begun Chantrix to stop smoking. She has researched bariatric surgery and would like to have a sleeve gastrectomy. She has had prior lap chole and TAH/BSO for endometriosis. No GERD.   I discussed sleeve gastectomy and roux en Y gastric bypass with her in detail including complications of bleeding and leaks and the long term complications of bypass including internal herniae and marginal ulcers especially with her arthritis and taking NSAIDS. Will pursue sleeve gastrectomy for her.    Past Surgical History Rita Bentley(Rita Bentley; 11/26/2016 2:51 PM) Colon Polyp Removal - Colonoscopy  Gallbladder Surgery - Laparoscopic  Hysterectomy (not due to cancer) - Complete  Tonsillectomy   Diagnostic Studies History Rita Bentley(Rita Bentley, Bentley; 11/26/2016 2:51 PM) Colonoscopy  within last year Mammogram  never Pap Smear  >5 years ago  Allergies Rita Bentley(Rita Bentley, Bentley; 11/26/2016 2:51 PM) Codeine Phosphate *ANALGESICS - OPIOID*  Nausea, Vomiting.  Medication History Rita Bentley(Rita Bentley; 11/26/2016 2:52 PM) CeleXA (10MG  Tablet, Oral) Active. Colace (100MG  Capsule, Oral) Active. Adipex-P (37.5MG  Tablet, Oral) Active. Varenicline Tartrate (0.5 MG X 11 &1 MG X 42 Misc, Oral) Active. Chantix (1MG  Tablet, Oral) Active. Medications  Reconciled  Social History Rita Bentley(Rita Bentley; 11/26/2016 2:51 PM) Alcohol use  Occasional alcohol use. Caffeine use  Carbonated beverages. No drug use  Tobacco use  Former smoker.  Family History Rita Bentley(Rita Bentley; 11/26/2016 2:51 PM) Arthritis  Father. Breast Cancer  Family Members In General. Colon Cancer  Family Members In General, Sister. Colon Polyps  Mother. Depression  Mother, Sister. Hypertension  Father, Mother.  Pregnancy / Birth History Rita Bentley(Rita Bentley; 11/26/2016 2:51 PM) Age at menarche  14 years. Gravida  3 Maternal age  44-20 Para  3  Other Problems Rita Bentley(Rita Bentley; 11/26/2016 2:51 PM) Anxiety Disorder  Cholelithiasis  Depression  Sleep Apnea     Review of Systems Rita Bentley(Rita Bentley Bentley; 11/26/2016 2:51 PM) General Present- Fatigue. Not Present- Appetite Loss, Chills, Fever, Night Sweats, Weight Gain and Weight Loss. Skin Not Present- Change in Wart/Mole, Dryness, Hives, Jaundice, New Lesions, Non-Healing Wounds, Rash and Ulcer. HEENT Present- Wears glasses/contact lenses. Not Present- Earache, Hearing Loss, Hoarseness, Nose Bleed, Oral Ulcers, Ringing in the Ears, Seasonal Allergies, Sinus Pain, Sore Throat, Visual Disturbances and Yellow Eyes. Respiratory Present- Snoring. Not Present- Bloody sputum, Chronic Cough, Difficulty Breathing and Wheezing. Breast Not Present- Breast Mass, Breast Pain, Nipple Discharge and Skin Changes. Cardiovascular Present- Difficulty Breathing Lying Down. Not Present- Chest Pain, Leg Cramps, Palpitations, Rapid Heart Rate, Shortness of Breath and Swelling of Extremities. Gastrointestinal Not Present- Abdominal Pain, Bloating, Bloody Stool, Change in Bowel Habits, Chronic diarrhea, Constipation, Difficulty Swallowing, Excessive gas, Gets full quickly at meals, Hemorrhoids, Indigestion, Nausea, Rectal Pain and Vomiting. Female Genitourinary Not Present- Frequency, Nocturia, Painful Urination,  Pelvic Pain and Urgency. Musculoskeletal Present- Back Pain. Not Present- Joint Pain, Joint Stiffness, Muscle Pain, Muscle Weakness and  Swelling of Extremities. Neurological Not Present- Decreased Memory, Fainting, Headaches, Numbness, Seizures, Tingling, Tremor, Trouble walking and Weakness. Psychiatric Present- Anxiety and Depression. Not Present- Bipolar, Change in Sleep Pattern, Fearful and Frequent crying. Hematology Not Present- Blood Thinners, Easy Bruising, Excessive bleeding, Gland problems, HIV and Persistent Infections.  Vitals Rita Bentley; 11/26/2016 2:53 PM) 11/26/2016 2:52 PM Weight: 206.2 lb Height: 62.5in Body Surface Area: 1.95 m Body Mass Index: 37.11 kg/m  Temp.: 98.70F  Pulse: 98 (Regular)  BP: 130/84 (Sitting, Left Arm, Standard)       Physical Exam (Rita Bentley; 11/26/2016 3:20 PM) General Note: Normally developed obese WF NAD HEENT unremarkable except for glasses Neck supple without masses Chest clear Heart SR without murmurs Abdomen is obese without masses or pain Ext pain in knees; no hx of DVT Neuro alert and oriented x 3. motor and sensory function is intact.     Assessment & Plan Rita Bentley; 11/26/2016 3:23 PM) MORBID OBESITY, UNSPECIFIED OBESITY TYPE (E66.01) Story: Two weeks prior to surgery Go on the extremely low carb liquid diet One week prior to surgery No aspirin products. Tylenol is acceptable Stop smoking 24 hours prior to surgery No alcoholic beverages Report fever greater than 100.5 or excessive nasal drainage suggesting infection Continue bariatric preop diet Perform bowel prep if ordered Do not eat or drink anything after midnight the night before surgery Do not take any medications except those instructed by the anesthesiologist Morning of surgery Please arrive at the hospital at least 2 hours before your scheduled surgery time. No makeup, fingernail polish or jewelry Bring insurance  cards with you Bring your CPAP mask if you use this Impression: Wt 206; Ht 5'2" and BMI 37. She has OSA and arthritis. I think that she is a good candidate for sleeve gastrectomy.  Matt B. Rita Deutscher, Bentley

## 2017-02-08 ENCOUNTER — Inpatient Hospital Stay (HOSPITAL_COMMUNITY): Payer: BLUE CROSS/BLUE SHIELD | Admitting: Certified Registered Nurse Anesthetist

## 2017-02-08 ENCOUNTER — Encounter (HOSPITAL_COMMUNITY)
Admission: RE | Disposition: A | Discharge status: Home / Self Care | Payer: Self-pay | Source: Ambulatory Visit | Attending: Surgery

## 2017-02-08 ENCOUNTER — Encounter (HOSPITAL_COMMUNITY): Payer: Self-pay | Admitting: *Deleted

## 2017-02-08 ENCOUNTER — Inpatient Hospital Stay (HOSPITAL_COMMUNITY)
Admission: RE | Admit: 2017-02-08 | Discharge: 2017-02-09 | DRG: 621 | Disposition: A | Discharge status: Home / Self Care | Payer: BLUE CROSS/BLUE SHIELD | Source: Ambulatory Visit | Attending: Surgery | Admitting: Surgery

## 2017-02-08 DIAGNOSIS — Z885 Allergy status to narcotic agent status: Secondary | ICD-10-CM | POA: Diagnosis not present

## 2017-02-08 DIAGNOSIS — M1990 Unspecified osteoarthritis, unspecified site: Secondary | ICD-10-CM | POA: Diagnosis present

## 2017-02-08 DIAGNOSIS — Z90710 Acquired absence of both cervix and uterus: Secondary | ICD-10-CM | POA: Diagnosis not present

## 2017-02-08 DIAGNOSIS — Z6841 Body Mass Index (BMI) 40.0 and over, adult: Secondary | ICD-10-CM | POA: Diagnosis not present

## 2017-02-08 DIAGNOSIS — Z79899 Other long term (current) drug therapy: Secondary | ICD-10-CM

## 2017-02-08 DIAGNOSIS — F329 Major depressive disorder, single episode, unspecified: Secondary | ICD-10-CM | POA: Diagnosis present

## 2017-02-08 DIAGNOSIS — Z8249 Family history of ischemic heart disease and other diseases of the circulatory system: Secondary | ICD-10-CM

## 2017-02-08 DIAGNOSIS — G4733 Obstructive sleep apnea (adult) (pediatric): Secondary | ICD-10-CM | POA: Diagnosis present

## 2017-02-08 DIAGNOSIS — F419 Anxiety disorder, unspecified: Secondary | ICD-10-CM | POA: Diagnosis present

## 2017-02-08 DIAGNOSIS — K8020 Calculus of gallbladder without cholecystitis without obstruction: Secondary | ICD-10-CM | POA: Diagnosis present

## 2017-02-08 DIAGNOSIS — Z818 Family history of other mental and behavioral disorders: Secondary | ICD-10-CM | POA: Diagnosis not present

## 2017-02-08 DIAGNOSIS — Z9884 Bariatric surgery status: Secondary | ICD-10-CM

## 2017-02-08 DIAGNOSIS — Z87891 Personal history of nicotine dependence: Secondary | ICD-10-CM

## 2017-02-08 HISTORY — PX: LAPAROSCOPIC GASTRIC SLEEVE RESECTION: SHX5895

## 2017-02-08 LAB — CBC WITH DIFFERENTIAL/PLATELET
BASOS ABS: 0 10*3/uL (ref 0.0–0.1)
Basophils Relative: 0 %
Eosinophils Absolute: 0.2 10*3/uL (ref 0.0–0.7)
Eosinophils Relative: 2 %
HEMATOCRIT: 41.4 % (ref 36.0–46.0)
Hemoglobin: 14.1 g/dL (ref 12.0–15.0)
LYMPHS PCT: 34 %
Lymphs Abs: 2.3 10*3/uL (ref 0.7–4.0)
MCH: 29.7 pg (ref 26.0–34.0)
MCHC: 34.1 g/dL (ref 30.0–36.0)
MCV: 87.2 fL (ref 78.0–100.0)
MONO ABS: 0.5 10*3/uL (ref 0.1–1.0)
Monocytes Relative: 7 %
NEUTROS ABS: 3.9 10*3/uL (ref 1.7–7.7)
Neutrophils Relative %: 57 %
Platelets: 311 10*3/uL (ref 150–400)
RBC: 4.75 MIL/uL (ref 3.87–5.11)
RDW: 13.6 % (ref 11.5–15.5)
WBC: 6.8 10*3/uL (ref 4.0–10.5)

## 2017-02-08 LAB — COMPREHENSIVE METABOLIC PANEL
ALK PHOS: 54 U/L (ref 38–126)
ALT: 27 U/L (ref 14–54)
AST: 30 U/L (ref 15–41)
Albumin: 4.3 g/dL (ref 3.5–5.0)
Anion gap: 9 (ref 5–15)
BILIRUBIN TOTAL: 1 mg/dL (ref 0.3–1.2)
BUN: 12 mg/dL (ref 6–20)
CALCIUM: 9.1 mg/dL (ref 8.9–10.3)
CO2: 26 mmol/L (ref 22–32)
Chloride: 107 mmol/L (ref 101–111)
Creatinine, Ser: 0.82 mg/dL (ref 0.44–1.00)
GFR calc Af Amer: >60 mL/min (ref >60)
Glucose, Bld: 100 mg/dL — ABNORMAL HIGH (ref 65–99)
POTASSIUM: 3.2 mmol/L — AB (ref 3.5–5.1)
Sodium: 142 mmol/L (ref 135–145)
TOTAL PROTEIN: 7.4 g/dL (ref 6.5–8.1)

## 2017-02-08 LAB — HEMOGLOBIN AND HEMATOCRIT, BLOOD
HEMATOCRIT: 36.8 % (ref 36.0–46.0)
HEMOGLOBIN: 12.6 g/dL (ref 12.0–15.0)

## 2017-02-08 SURGERY — GASTRECTOMY, SLEEVE, LAPAROSCOPIC
Anesthesia: General

## 2017-02-08 MED ORDER — SUCCINYLCHOLINE CHLORIDE 200 MG/10ML IV SOSY
PREFILLED_SYRINGE | INTRAVENOUS | Status: AC
  Filled 2017-02-08: qty 10

## 2017-02-08 MED ORDER — PROPOFOL 10 MG/ML IV BOLUS
INTRAVENOUS | Status: DC | PRN
  Administered 2017-02-08: 150 mg via INTRAVENOUS

## 2017-02-08 MED ORDER — APREPITANT 40 MG PO CAPS
40.0000 mg | ORAL_CAPSULE | ORAL | Status: AC
  Administered 2017-02-08: 40 mg via ORAL
  Filled 2017-02-08: qty 1

## 2017-02-08 MED ORDER — FENTANYL CITRATE (PF) 100 MCG/2ML IJ SOLN
INTRAMUSCULAR | Status: AC
Start: 2017-02-08 — End: 2017-02-09
  Filled 2017-02-08: qty 2

## 2017-02-08 MED ORDER — LIDOCAINE 2% (20 MG/ML) 5 ML SYRINGE
INTRAMUSCULAR | Status: DC | PRN
  Administered 2017-02-08: 80 mg via INTRAVENOUS

## 2017-02-08 MED ORDER — FENTANYL CITRATE (PF) 250 MCG/5ML IJ SOLN
INTRAMUSCULAR | Status: AC
  Filled 2017-02-08: qty 5

## 2017-02-08 MED ORDER — APREPITANT 40 MG PO CAPS: 40.0000 mg | ORAL_CAPSULE | ORAL | Status: DC

## 2017-02-08 MED ORDER — ONDANSETRON HCL 4 MG/2ML IJ SOLN: 4.0000 mg | INTRAMUSCULAR | Status: DC | PRN

## 2017-02-08 MED ORDER — METOCLOPRAMIDE HCL 5 MG/ML IJ SOLN: 10.0000 mg | Freq: Once | INTRAMUSCULAR | Status: DC | PRN

## 2017-02-08 MED ORDER — FENTANYL CITRATE (PF) 100 MCG/2ML IJ SOLN
25.0000 ug | INTRAMUSCULAR | Status: DC | PRN
  Administered 2017-02-08 (×2): 50 ug via INTRAVENOUS

## 2017-02-08 MED ORDER — FENTANYL CITRATE (PF) 100 MCG/2ML IJ SOLN
INTRAMUSCULAR | Status: DC | PRN
  Administered 2017-02-08 (×3): 50 ug via INTRAVENOUS

## 2017-02-08 MED ORDER — MORPHINE SULFATE (PF) 2 MG/ML IV SOLN
1.0000 mg | INTRAVENOUS | Status: DC | PRN
  Administered 2017-02-08: 2 mg via INTRAVENOUS
  Filled 2017-02-08: qty 1
  Filled 2017-02-08: qty 2

## 2017-02-08 MED ORDER — OXYCODONE HCL 5 MG/5ML PO SOLN
5.0000 mg | ORAL | Status: DC | PRN
  Administered 2017-02-09 (×4): 5 mg via ORAL
  Filled 2017-02-08 (×4): qty 5

## 2017-02-08 MED ORDER — MIDAZOLAM HCL 5 MG/5ML IJ SOLN
INTRAMUSCULAR | Status: DC | PRN
  Administered 2017-02-08: 2 mg via INTRAVENOUS

## 2017-02-08 MED ORDER — CHLORHEXIDINE GLUCONATE CLOTH 2 % EX PADS: 6.0000 | MEDICATED_PAD | Freq: Once | CUTANEOUS | Status: DC

## 2017-02-08 MED ORDER — PHENYLEPHRINE 40 MCG/ML (10ML) SYRINGE FOR IV PUSH (FOR BLOOD PRESSURE SUPPORT)
PREFILLED_SYRINGE | INTRAVENOUS | Status: DC | PRN
  Administered 2017-02-08 (×4): 80 ug via INTRAVENOUS

## 2017-02-08 MED ORDER — ROCURONIUM BROMIDE 50 MG/5ML IV SOSY
PREFILLED_SYRINGE | INTRAVENOUS | Status: AC
  Filled 2017-02-08: qty 5

## 2017-02-08 MED ORDER — PROPOFOL 10 MG/ML IV BOLUS
INTRAVENOUS | Status: AC
  Filled 2017-02-08: qty 40

## 2017-02-08 MED ORDER — EPHEDRINE SULFATE-NACL 50-0.9 MG/10ML-% IV SOSY
PREFILLED_SYRINGE | INTRAVENOUS | Status: DC | PRN
  Administered 2017-02-08: 10 mg via INTRAVENOUS

## 2017-02-08 MED ORDER — LIDOCAINE 2% (20 MG/ML) 5 ML SYRINGE
INTRAMUSCULAR | Status: AC
  Filled 2017-02-08: qty 5

## 2017-02-08 MED ORDER — HEPARIN SODIUM (PORCINE) 5000 UNIT/ML IJ SOLN
5000.0000 [IU] | INTRAMUSCULAR | Status: AC
  Administered 2017-02-08: 5000 [IU] via SUBCUTANEOUS
  Filled 2017-02-08: qty 1

## 2017-02-08 MED ORDER — HYDROMORPHONE HCL 1 MG/ML IJ SOLN
0.5000 mg | INTRAMUSCULAR | Status: DC | PRN
  Administered 2017-02-08 – 2017-02-09 (×3): 0.5 mg via INTRAVENOUS
  Filled 2017-02-08 (×3): qty 0.5

## 2017-02-08 MED ORDER — PHENYLEPHRINE 40 MCG/ML (10ML) SYRINGE FOR IV PUSH (FOR BLOOD PRESSURE SUPPORT)
PREFILLED_SYRINGE | INTRAVENOUS | Status: AC
  Filled 2017-02-08: qty 10

## 2017-02-08 MED ORDER — DEXAMETHASONE SODIUM PHOSPHATE 10 MG/ML IJ SOLN
INTRAMUSCULAR | Status: DC | PRN
  Administered 2017-02-08: 10 mg via INTRAVENOUS

## 2017-02-08 MED ORDER — BUPIVACAINE LIPOSOME 1.3 % IJ SUSP
20.0000 mL | Freq: Once | INTRAMUSCULAR | Status: AC
  Administered 2017-02-08: 20 mL
  Filled 2017-02-08: qty 20

## 2017-02-08 MED ORDER — HEPARIN SODIUM (PORCINE) 5000 UNIT/ML IJ SOLN
5000.0000 [IU] | Freq: Three times a day (TID) | INTRAMUSCULAR | Status: DC
  Administered 2017-02-08 – 2017-02-09 (×3): 5000 [IU] via SUBCUTANEOUS
  Filled 2017-02-08 (×3): qty 1

## 2017-02-08 MED ORDER — ACETAMINOPHEN 325 MG PO TABS: 650.0000 mg | ORAL_TABLET | ORAL | Status: DC | PRN

## 2017-02-08 MED ORDER — MEPERIDINE HCL 50 MG/ML IJ SOLN: 6.2500 mg | INTRAMUSCULAR | Status: DC | PRN

## 2017-02-08 MED ORDER — CEFOTETAN DISODIUM-DEXTROSE 2-2.08 GM-% IV SOLR
2.0000 g | INTRAVENOUS | Status: AC
  Administered 2017-02-08: 2 g via INTRAVENOUS
  Filled 2017-02-08: qty 50

## 2017-02-08 MED ORDER — MIDAZOLAM HCL 2 MG/2ML IJ SOLN
INTRAMUSCULAR | Status: AC
  Filled 2017-02-08: qty 2

## 2017-02-08 MED ORDER — LACTATED RINGERS IV SOLN
INTRAVENOUS | Status: DC
  Administered 2017-02-08 (×2): via INTRAVENOUS

## 2017-02-08 MED ORDER — SUGAMMADEX SODIUM 200 MG/2ML IV SOLN
INTRAVENOUS | Status: DC | PRN
  Administered 2017-02-08: 200 mg via INTRAVENOUS

## 2017-02-08 MED ORDER — KCL IN DEXTROSE-NACL 20-5-0.45 MEQ/L-%-% IV SOLN
INTRAVENOUS | Status: DC
  Administered 2017-02-08: 19:00:00 via INTRAVENOUS
  Administered 2017-02-09: 100 mL via INTRAVENOUS
  Filled 2017-02-08 (×2): qty 1000

## 2017-02-08 MED ORDER — ACETAMINOPHEN 160 MG/5ML PO SOLN: 325.0000 mg | ORAL | Status: DC | PRN

## 2017-02-08 MED ORDER — ONDANSETRON HCL 4 MG/2ML IJ SOLN
INTRAMUSCULAR | Status: DC | PRN
  Administered 2017-02-08: 4 mg via INTRAVENOUS

## 2017-02-08 MED ORDER — ONDANSETRON HCL 4 MG/2ML IJ SOLN
INTRAMUSCULAR | Status: AC
  Filled 2017-02-08: qty 2

## 2017-02-08 MED ORDER — SUGAMMADEX SODIUM 200 MG/2ML IV SOLN
INTRAVENOUS | Status: AC
  Filled 2017-02-08: qty 2

## 2017-02-08 MED ORDER — ROCURONIUM BROMIDE 10 MG/ML (PF) SYRINGE
PREFILLED_SYRINGE | INTRAVENOUS | Status: DC | PRN
  Administered 2017-02-08: 50 mg via INTRAVENOUS
  Administered 2017-02-08 (×2): 10 mg via INTRAVENOUS

## 2017-02-08 MED ORDER — PANTOPRAZOLE SODIUM 40 MG IV SOLR
40.0000 mg | Freq: Every day | INTRAVENOUS | Status: DC
  Administered 2017-02-08: 40 mg via INTRAVENOUS
  Filled 2017-02-08: qty 40

## 2017-02-08 MED ORDER — SCOPOLAMINE 1 MG/3DAYS TD PT72
MEDICATED_PATCH | TRANSDERMAL | Status: AC
  Administered 2017-02-08: 11:00:00
  Filled 2017-02-08: qty 1

## 2017-02-08 MED ORDER — PREMIER PROTEIN SHAKE: 2.0000 [oz_av] | ORAL | Status: DC

## 2017-02-08 MED ORDER — DEXAMETHASONE SODIUM PHOSPHATE 10 MG/ML IJ SOLN
INTRAMUSCULAR | Status: AC
  Filled 2017-02-08: qty 1

## 2017-02-08 SURGICAL SUPPLY — 67 items
ADH SKN CLS APL DERMABOND .7 (GAUZE/BANDAGES/DRESSINGS) ×1
APPLICATOR COTTON TIP 6IN STRL (MISCELLANEOUS) IMPLANT
APPLIER CLIP 5 13 M/L LIGAMAX5 (MISCELLANEOUS)
APPLIER CLIP ROT 10 11.4 M/L (STAPLE)
APPLIER CLIP ROT 13.4 12 LRG (CLIP)
APR CLP LRG 13.4X12 ROT 20 MLT (CLIP)
APR CLP MED LRG 11.4X10 (STAPLE)
APR CLP MED LRG 5 ANG JAW (MISCELLANEOUS)
BAG LAPAROSCOPIC 12 15 PORT 16 (BASKET) IMPLANT
BAG RETRIEVAL 12/15 (BASKET) ×2
BAG RETRIEVAL 12/15MM (BASKET) ×1
BLADE SURG 15 STRL LF DISP TIS (BLADE) ×1 IMPLANT
BLADE SURG 15 STRL SS (BLADE) ×3
CABLE HIGH FREQUENCY MONO STRZ (ELECTRODE) ×3 IMPLANT
CLIP APPLIE 5 13 M/L LIGAMAX5 (MISCELLANEOUS) IMPLANT
CLIP APPLIE ROT 10 11.4 M/L (STAPLE) IMPLANT
CLIP APPLIE ROT 13.4 12 LRG (CLIP) IMPLANT
DERMABOND ADVANCED (GAUZE/BANDAGES/DRESSINGS) ×2
DERMABOND ADVANCED .7 DNX12 (GAUZE/BANDAGES/DRESSINGS) IMPLANT
DEVICE SUT QUICK LOAD TK 5 (STAPLE) IMPLANT
DEVICE SUT TI-KNOT TK 5X26 (MISCELLANEOUS) IMPLANT
DEVICE SUTURE ENDOST 10MM (ENDOMECHANICALS) IMPLANT
DEVICE TI KNOT TK5 (MISCELLANEOUS)
DEVICE TROCAR PUNCTURE CLOSURE (ENDOMECHANICALS) ×3 IMPLANT
DISSECTOR BLUNT TIP ENDO 5MM (MISCELLANEOUS) IMPLANT
ELECT REM PT RETURN 15FT ADLT (MISCELLANEOUS) ×3 IMPLANT
GAUZE SPONGE 4X4 12PLY STRL (GAUZE/BANDAGES/DRESSINGS) IMPLANT
GLOVE BIOGEL M 8.0 STRL (GLOVE) ×3 IMPLANT
GOWN STRL REUS W/TWL XL LVL3 (GOWN DISPOSABLE) ×12 IMPLANT
HANDLE STAPLE EGIA 4 XL (STAPLE) ×3 IMPLANT
HOVERMATT SINGLE USE (MISCELLANEOUS) ×3 IMPLANT
KIT BASIN OR (CUSTOM PROCEDURE TRAY) ×3 IMPLANT
MARKER SKIN DUAL TIP RULER LAB (MISCELLANEOUS) ×3 IMPLANT
NDL SPNL 22GX3.5 QUINCKE BK (NEEDLE) ×1 IMPLANT
NEEDLE SPNL 22GX3.5 QUINCKE BK (NEEDLE) ×3 IMPLANT
PACK UNIVERSAL I (CUSTOM PROCEDURE TRAY) ×3 IMPLANT
QUICK LOAD TK 5 (STAPLE)
RELOAD STAPLE 45 PURP MED/THCK (STAPLE) IMPLANT
RELOAD TRI 45 ART MED THCK BLK (STAPLE) ×3 IMPLANT
RELOAD TRI 45 ART MED THCK PUR (STAPLE) ×3 IMPLANT
RELOAD TRI 60 ART MED THCK BLK (STAPLE) ×3 IMPLANT
RELOAD TRI 60 ART MED THCK PUR (STAPLE) ×6 IMPLANT
SCISSORS LAP 5X45 EPIX DISP (ENDOMECHANICALS) IMPLANT
SET IRRIG TUBING LAPAROSCOPIC (IRRIGATION / IRRIGATOR) ×3 IMPLANT
SHEARS HARMONIC ACE PLUS 45CM (MISCELLANEOUS) ×3 IMPLANT
SLEEVE ADV FIXATION 5X100MM (TROCAR) ×6 IMPLANT
SLEEVE GASTRECTOMY 36FR VISIGI (MISCELLANEOUS) ×3 IMPLANT
SOLUTION ANTI FOG 6CC (MISCELLANEOUS) ×3 IMPLANT
SPONGE LAP 18X18 X RAY DECT (DISPOSABLE) ×3 IMPLANT
STAPLER VISISTAT 35W (STAPLE) ×3 IMPLANT
SUT SURGIDAC NAB ES-9 0 48 120 (SUTURE) IMPLANT
SUT VIC AB 4-0 SH 18 (SUTURE) ×3 IMPLANT
SUT VICRYL 0 TIES 12 18 (SUTURE) ×3 IMPLANT
SYR 10ML ECCENTRIC (SYRINGE) ×3 IMPLANT
SYR 20CC LL (SYRINGE) ×3 IMPLANT
SYR 50ML LL SCALE MARK (SYRINGE) ×3 IMPLANT
TOWEL OR 17X26 10 PK STRL BLUE (TOWEL DISPOSABLE) ×6 IMPLANT
TOWEL OR NON WOVEN STRL DISP B (DISPOSABLE) ×3 IMPLANT
TRAY FOLEY W/METER SILVER 16FR (SET/KITS/TRAYS/PACK) IMPLANT
TROCAR ADV FIXATION 5X100MM (TROCAR) ×3 IMPLANT
TROCAR BLADELESS 15MM (ENDOMECHANICALS) ×3 IMPLANT
TROCAR BLADELESS OPT 5 100 (ENDOMECHANICALS) ×3 IMPLANT
TUBE CALIBRATION LAPBAND (TUBING) IMPLANT
TUBING CONNECTING 10 (TUBING) ×2 IMPLANT
TUBING CONNECTING 10' (TUBING) ×1
TUBING ENDO SMARTCAP (MISCELLANEOUS) ×3 IMPLANT
TUBING INSUF HEATED (TUBING) ×3 IMPLANT

## 2017-02-08 NOTE — Anesthesia Procedure Notes (Signed)
Procedure Name: Intubation Date/Time: 02/08/2017 11:46 AM Performed by: West Pugh Pre-anesthesia Checklist: Patient identified, Emergency Drugs available, Suction available, Patient being monitored and Timeout performed Patient Re-evaluated:Patient Re-evaluated prior to inductionOxygen Delivery Method: Circle system utilized Preoxygenation: Pre-oxygenation with 100% oxygen Intubation Type: IV induction and Cricoid Pressure applied Ventilation: Mask ventilation without difficulty Laryngoscope Size: Mac and 4 Grade View: Grade I Tube type: Oral Tube size: 7.0 mm Number of attempts: 1 Airway Equipment and Method: Stylet Placement Confirmation: ETT inserted through vocal cords under direct vision,  positive ETCO2,  CO2 detector and breath sounds checked- equal and bilateral Secured at: 21 cm Tube secured with: Tape Dental Injury: Teeth and Oropharynx as per pre-operative assessment

## 2017-02-08 NOTE — Anesthesia Postprocedure Evaluation (Signed)
Anesthesia Post Note  Patient: Haynes BastJennifer Elaine Bur  Procedure(s) Performed: Procedure(s) (LRB): LAPAROSCOPIC GASTRIC SLEEVE RESECTION, UPPER ENDO (N/A)     Patient location during evaluation: PACU Anesthesia Type: General Level of consciousness: awake and alert Pain management: pain level controlled Vital Signs Assessment: post-procedure vital signs reviewed and stable Respiratory status: spontaneous breathing, nonlabored ventilation and respiratory function stable Cardiovascular status: blood pressure returned to baseline and stable Postop Assessment: no signs of nausea or vomiting Anesthetic complications: no    Last Vitals:  Vitals:   02/08/17 1401 02/08/17 1430  BP: (!) 110/59 105/65  Pulse: 70 72  Resp: 16 13  Temp:  (!) 36.3 C    Last Pain:  Vitals:   02/08/17 1430  TempSrc:   PainSc: 2                  Jewett Mcgann A.

## 2017-02-08 NOTE — Op Note (Signed)
Surgeon: Wenda LowMatt Lucille Witts, MD, FACS  Asst:  Feliciana RossettiLuke Kinsinger, MD  Anes:  General endotracheal  Procedure: Laparoscopic sleeve gastrectomy and upper endoscopy  Diagnosis: Morbid obesity  Complications: None noted  EBL:   minimal cc  Description of Procedure:  The patient was take to OR 2 and given general anesthesia.  The abdomen was prepped with Technicare and draped sterilely.  A timeout was performed.  Access to the abdomen was achieved with a 5 mm Optiview through the left upper quadrant.  Following insufflation, the state of the abdomen was found to be free of adhesions.  The ViSiGi 36Fr tube was inserted to deflate the stomach and was pulled back into the esophagus.    The pylorus was identified and we measured 5 cm back and marked the antrum.  At that point we began dissection to take down the greater curvature of the stomach using the Harmonic scalpel.  This dissection was taken all the way up to the left crus.  Posterior attachments of the stomach were also taken down.    The ViSiGi tube was then passed into the antrum and suction applied so that it was snug along the lessor curvature.  The "crow's foot" or incisura was identified.  The sleeve gastrectomy was begun using the [employer] International[employer]ov[employer] platform stapler beginning with a 4.5 cm black load with TRS.  This was followed with a 6 cm black load with tRS and then purple loads with TRS.  When the sleeve was complete the tube was taken off suction and insufflated briefly.  The tube was withdrawn.  Upper endoscopy was then performed by Dr. Sheliah HatchKinsinger.  No bleeding or leaks were noted and the sleeve was cylindrical.     The specimen was extracted through the 15 trocar site.  Wounds were infiltrated with Exparel and the 15 mm port was closed with the endoclose and a0 vicryl and the skin   closed with 4-0 monocryl and Dermabond.    Matt B. Daphine DeutscherMartin, MD, [facility], [place] [phone]

## 2017-02-08 NOTE — Progress Notes (Signed)
Received call from Dr. Daphine DeutscherMartin regarding previous note. Discussed pt status and request for Dilaudid. Verbal orders for Dilaudid IV 0.5 mg-1 mg every 1-2 hours for moderate to severe pain and to discontinue Morphine orders. VORBV Lytle ButteAnne Saree Krogh, RN 1700  Spoke w/pharmacy re: above. They stated range for time is not allowed [facility]. Order will need to be changed to either prn every 1 hr or prn every 2 hr. Will page on call MD, Dr. Ezzard StandingNewman to clarify, since it is after 1700.  Paged Dr. Ezzard StandingNewman, on call MD and discussed above. VO clarified for 0.5 mg-1mg  IV Dilaudid PRN every 1 hour for moderate to severe pain.   When placing order, contraindiation w/codeine for n/v came up. Discussed w/Dr. Ezzard StandingNewman and pt. Dr. Ezzard StandingNewman comfortable w/pt deciding whether to take Dilaudid or go back to morphine. Pt chose to start Dilaudid.   Checked on pt after administering Dilaudid and she said she was feeling much more comfortable and getting relief. Will continue to monitor.

## 2017-02-08 NOTE — H&P (View-Only) (Signed)
Rita Bentley 11/26/2016 2:51 PM Location: [facility] Patient #: [id number] DOB: 11/23/1970 Married / Language: English / Race: White Female   History of Present Illness (Rita Bentley B. Rita Rainbow MD; 11/26/2016 3:26 PM) The patient is a 45 year old female who presents for a bariatric surgery evaluation. Rita Bentley has reviewed our online seminar and her sister recently had a gastric bypass in [place]. She has been obese her entire adult life and has had mixed success with Phentermine (twice attempts), Weight watchers (4x), Slim Fast at least 5 times. 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 She has recently begun Rita Bentley to stop smoking. She has researched bariatric surgery and would like to have a sleeve gastrectomy. She has had prior lap chole and TAH/BSO for endometriosis. No GERD.   I discussed sleeve gastectomy and roux en Y gastric bypass with her in detail including complications of bleeding and leaks and the long term complications of bypass including internal herniae and marginal ulcers especially with her arthritis and taking NSAIDS. Will pursue sleeve gastrectomy for her.    Past Surgical History (Christen Lambert, RMA; 11/26/2016 2:51 PM) Colon Polyp Removal - Colonoscopy  Gallbladder Surgery - Laparoscopic  Hysterectomy (not due to cancer) - Complete  Tonsillectomy   Diagnostic Studies History (Christen Lambert, RMA; 11/26/2016 2:51 PM) Colonoscopy  within last year Mammogram  never Pap Smear  >5 years ago  Allergies (Christen Lambert, RMA; 11/26/2016 2:51 PM) Codeine Phosphate *ANALGESICS - OPIOID*  Nausea, Vomiting.  Medication History (Christen Lambert, RMA; 11/26/2016 2:52 PM) CeleXA (10MG Tablet, Oral) Active. Colace (100MG Capsule, Oral) Active. Adipex-P (37.5MG Tablet, Oral) Active. Varenicline Tartrate (0.5 MG X 11 &1 MG X 42 Misc, Oral) Active. Chantix (1MG Tablet, Oral) Active. Medications  Reconciled  Social History (Christen Lambert, RMA; 11/26/2016 2:51 PM) Alcohol use  Occasional alcohol use. Caffeine use  Carbonated beverages. No drug use  Tobacco use  Former smoker.  Family History (Christen Lambert, RMA; 11/26/2016 2:51 PM) Arthritis  Father. Breast Cancer  Family Members In General. Colon Cancer  Family Members In General, Sister. Colon Polyps  Mother. Depression  Mother, Sister. Hypertension  Father, Mother.  Pregnancy / Birth History (Christen Lambert, RMA; 11/26/2016 2:51 PM) Age at menarche  14 years. Gravida  3 Maternal age  15-20 Para  3  Other Problems (Christen Lambert, RMA; 11/26/2016 2:51 PM) Anxiety Disorder  Cholelithiasis  Depression  Sleep Apnea     Review of Systems (Christen Lambert RMA; 11/26/2016 2:51 PM) General Present- Fatigue. Not Present- Appetite Loss, Chills, Fever, Night Sweats, Weight Gain and Weight Loss. Skin Not Present- Change in Wart/Mole, Dryness, Hives, Jaundice, New Lesions, Non-Healing Wounds, Rash and Ulcer. HEENT Present- Wears glasses/contact lenses. Not Present- Earache, Hearing Loss, Hoarseness, Nose Bleed, Oral Ulcers, Ringing in the Ears, Seasonal Allergies, Sinus Pain, Sore Throat, Visual Disturbances and Yellow Eyes. Respiratory Present- Snoring. Not Present- Bloody sputum, Chronic Cough, Difficulty Breathing and Wheezing. Breast Not Present- Breast Mass, Breast Pain, Nipple Discharge and Skin Changes. Cardiovascular Present- Difficulty Breathing Lying Down. Not Present- Chest Pain, Leg Cramps, Palpitations, Rapid Heart Rate, Shortness of Breath and Swelling of Extremities. Gastrointestinal Not Present- Abdominal Pain, Bloating, Bloody Stool, Change in Bowel Habits, Chronic diarrhea, Constipation, Difficulty Swallowing, Excessive gas, Gets full quickly at meals, Hemorrhoids, Indigestion, Nausea, Rectal Pain and Vomiting. Female Genitourinary Not Present- Frequency, Nocturia, Painful Urination,  Pelvic Pain and Urgency. Musculoskeletal Present- Back Pain. Not Present- Joint Pain, Joint Stiffness, Muscle Pain, Muscle Weakness and   Swelling of Extremities. Neurological Not Present- Decreased Memory, Fainting, Headaches, Numbness, Seizures, Tingling, Tremor, Trouble walking and Weakness. Psychiatric Present- Anxiety and Depression. Not Present- Bipolar, Change in Sleep Pattern, Fearful and Frequent crying. Hematology Not Present- Blood Thinners, Easy Bruising, Excessive bleeding, Gland problems, HIV and Persistent Infections.  Vitals (Christen Lambert RMA; 11/26/2016 2:53 PM) 11/26/2016 2:52 PM Weight: 206.2 lb Height: 62.5in Body Surface Area: 1.95 m Body Mass Index: 37.11 kg/m  Temp.: 98.2F  Pulse: 98 (Regular)  BP: 130/84 (Sitting, Left Arm, Standard)       Physical Exam (Karey Suthers B. Merlie Noga MD; 11/26/2016 3:20 PM) General Note: Normally developed obese WF NAD HEENT unremarkable except for glasses Neck supple without masses Chest clear Heart SR without murmurs Abdomen is obese without masses or pain Ext pain in knees; no hx of DVT Neuro alert and oriented x 3. motor and sensory function is intact.     Assessment & Plan (Nolah Krenzer B. Carnell Beavers MD; 11/26/2016 3:23 PM) MORBID OBESITY, UNSPECIFIED OBESITY TYPE (E66.01) Story: Two weeks prior to surgery Go on the extremely low carb liquid diet One week prior to surgery No aspirin products. Tylenol is acceptable Stop smoking 24 hours prior to surgery No alcoholic beverages Report fever greater than 100.5 or excessive nasal drainage suggesting infection Continue bariatric preop diet Perform bowel prep if ordered Do not eat or drink anything after midnight the night before surgery Do not take any medications except those instructed by the anesthesiologist Morning of surgery Please arrive at the hospital at least 2 hours before your scheduled surgery time. No makeup, fingernail polish or jewelry Bring insurance  cards with you Bring your CPAP mask if you use this Impression: Wt 206; Ht 5'2" and BMI 37. She has OSA and arthritis. I think that she is a good candidate for sleeve gastrectomy.  Matt B. Omid Deardorff, MD  

## 2017-02-08 NOTE — Anesthesia Preprocedure Evaluation (Addendum)
Anesthesia Evaluation  Patient identified by MRN, date of birth, ID band Patient awake    Reviewed: Allergy & Precautions, NPO status , Patient's Chart, lab work & pertinent test results  History of Anesthesia Complications (+) history of anesthetic complications  Airway Mallampati: III       Dental  (+) Upper Dentures, Missing   Pulmonary sleep apnea , former smoker,    Pulmonary exam normal breath sounds clear to auscultation       Cardiovascular negative cardio ROS Normal cardiovascular exam Rhythm:Regular Rate:Normal     Neuro/Psych PSYCHIATRIC DISORDERS Anxiety Depression    GI/Hepatic Neg liver ROS, GERD  Medicated,  Endo/Other  Morbid obesity  Renal/GU Hx/o renal calculi  negative genitourinary   Musculoskeletal  (+) Arthritis ,   Abdominal (+) + obese,   Peds  Hematology negative hematology ROS (+)   Anesthesia Other Findings   Reproductive/Obstetrics                            Anesthesia Physical Anesthesia Plan  ASA: III  Anesthesia Plan: General   Post-op Pain Management:    Induction: Intravenous  PONV Risk Score and Plan: 4 or greater and Ondansetron, Dexamethasone, Propofol, Midazolam, Scopolamine patch - Pre-op and Treatment may vary due to age or medical condition  Airway Management Planned: Oral ETT  Additional Equipment:   Intra-op Plan:   Post-operative Plan:   Informed Consent: I have reviewed the patients History and Physical, chart, labs and discussed the procedure including the risks, benefits and alternatives for the proposed anesthesia with the patient or authorized representative who has indicated his/her understanding and acceptance.   Dental advisory given  Plan Discussed with: CRNA, Anesthesiologist and Surgeon  Anesthesia Plan Comments:         Anesthesia Quick Evaluation

## 2017-02-08 NOTE — Progress Notes (Signed)
No fluid order in for pt. Paged Dr. Ezzard StandingNewman.  Dr. Ezzard StandingNewman returned call. VORB D5 1/2 NS w/K20 @ 14500mL/hour.   Order placed.

## 2017-02-08 NOTE — Op Note (Signed)
Preoperative diagnosis: laparoscopic sleeve gastrectomy  Postoperative diagnosis: Same   Procedure: Upper endoscopy   Surgeon: Luke Kinsinger, M.D.  Anesthesia: Gen.   Indications for procedure: This patient was undergoing a laparoscopic sleeve gastrectomy.   Description of procedure: The endoscopy was placed in the mouth and into the oropharynx and under endoscopic vision it was advanced to the esophagogastric junction. The pouch was insufflated and no bleeding or bubbles were seen.No bleeding or leaks were detected. The scope was withdrawn without difficulty.   Luke Kinsinger, M.D. General, Bariatric, & Minimally Invasive Surgery [facility]    

## 2017-02-08 NOTE — Progress Notes (Signed)
Pt refused option to receive morphine for her pain (7/10). States morphine isn't effective with pain. Says it didn't when she had a hysterectomy in the past, either, and they changed the pain medication to dilaudid. Requesting dilaudid, instead. Offered emotional support and Ice/heat. Pt asked for heat and states it is providing some relief.  Paged Dr. Daphine DeutscherMartin re: above. Will continue to monitor pt.

## 2017-02-08 NOTE — Progress Notes (Signed)
Spoke with pt regarding cpap.  Pt refused cpap tonight stating she prefers to wear oxygen (nasal cannula) tonight.  Pt was advised that RT is available all night and encouraged her to call, should she change her mind.

## 2017-02-08 NOTE — Transfer of Care (Signed)
Immediate Anesthesia Transfer of Care Note  Patient: Rita Bentley  Procedure(s) Performed: Procedure(s): LAPAROSCOPIC GASTRIC SLEEVE RESECTION, UPPER ENDO (N/A)  Patient Location: PACU  Anesthesia Type:General  Level of Consciousness:  sedated, patient cooperative and responds to stimulation  Airway & Oxygen Therapy:Patient Spontanous Breathing and Patient connected to face mask oxgen  Post-op Assessment:  Report given to PACU RN and Post -op Vital signs reviewed and stable  Post vital signs:  Reviewed and stable  Last Vitals:  Vitals:   02/08/17 0827  BP: 122/73  Pulse: 76  Resp: 18  Temp: 36.7 C    Complications: No apparent anesthesia complications

## 2017-02-08 NOTE — Interval H&P Note (Signed)
History and Physical Interval Note:  02/08/2017 11:20 AM  Rita Bentley  has presented today for surgery, with the diagnosis of Morbid Obesity, OSA, Osteoarthritis, Joint Pain  The various methods of treatment have been discussed with the patient and family. After consideration of risks, benefits and other options for treatment, the patient has consented to  Procedure(s): LAPAROSCOPIC GASTRIC SLEEVE RESECTION, UPPER ENDO (N/A) as a surgical intervention .  The patient's history has been reviewed, patient examined, no change in status, stable for surgery.  I have reviewed the patient's chart and labs.  Questions were answered to the patient's satisfaction.     Elmina Hendel B

## 2017-02-09 LAB — CBC WITH DIFFERENTIAL/PLATELET
BASOS ABS: 0 10*3/uL (ref 0.0–0.1)
Basophils Relative: 0 %
EOS PCT: 0 %
Eosinophils Absolute: 0 10*3/uL (ref 0.0–0.7)
HEMATOCRIT: 35 % — AB (ref 36.0–46.0)
Hemoglobin: 11.9 g/dL — ABNORMAL LOW (ref 12.0–15.0)
LYMPHS ABS: 1.5 10*3/uL (ref 0.7–4.0)
LYMPHS PCT: 16 %
MCH: 30.1 pg (ref 26.0–34.0)
MCHC: 34 g/dL (ref 30.0–36.0)
MCV: 88.6 fL (ref 78.0–100.0)
Monocytes Absolute: 0.6 10*3/uL (ref 0.1–1.0)
Monocytes Relative: 7 %
NEUTROS ABS: 7.1 10*3/uL (ref 1.7–7.7)
Neutrophils Relative %: 77 %
Platelets: 280 10*3/uL (ref 150–400)
RBC: 3.95 MIL/uL (ref 3.87–5.11)
RDW: 13.9 % (ref 11.5–15.5)
WBC: 9.2 10*3/uL (ref 4.0–10.5)

## 2017-02-09 MED ORDER — PREMIER PROTEIN SHAKE: 2.0000 [oz_av] | ORAL | 12 refills | Status: DC

## 2017-02-09 MED ORDER — OXYCODONE HCL 5 MG/5ML PO SOLN: 5.0000 mg | ORAL | 0 refills | Status: DC | PRN

## 2017-02-09 NOTE — Progress Notes (Signed)
Reviewed AVS and discharge summary with pt and husband. Questions/concerns answered to pt satisfaction. Pt discharged in stable condition w/belongings via WC home w/husband.

## 2017-02-09 NOTE — Progress Notes (Signed)
Patient alert and oriented, Post op day 1.  Provided support and encouragement.  Encouraged pulmonary toilet, ambulation and small sips of liquids.  Patient tolerated 12 ounces of clear fluids advanced to protein shake.  Denies pain or nausea.  Only concern was lower blood pressure.  She is not symptomatic with lower BP.  All questions answered.  Will continue to monitor.

## 2017-02-09 NOTE — Progress Notes (Signed)
Patient alert and oriented, pain is controlled. Patient is tolerating fluids, advanced to protein shake today,   patient is tolerating well.  She has completed 4 ounces of her 6 ounce protein goal at this time. Reviewed Gastric sleeve discharge instructions with patient and patient is able to articulate understanding.  Provided information on BELT program, Support Group and [facility] outpatient pharmacy. All questions answered, will continue to monitor.

## 2017-02-09 NOTE — Discharge Summary (Signed)
Physician Discharge Summary  Patient ID: Rita Bentley MRN: [id number] DOB/AGE: 1971/03/31 46 y.o.  Admit date: 02/08/2017 Discharge date: 02/09/2017  Admission Diagnoses: Patient Active Problem List   Diagnosis Date Noted  . 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 Paronychia of toe 12/06/2016  . Smoker 11/08/2016  . Palpitations 08/16/2016  . Obesity 08/16/2016  . Annual physical exam 07/19/2016  . Anxiety and depression 07/19/2016  . 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  She did well overnight.  Her pain is well controlled.  She denies nausea/vomiting.  She is tolerating liquid diet per protocol.  She is taking oxycodone elixir without issue.  She reports having the oxycodone at home already.  She has been ambulating well already this AM.    Consults: None  Significant Diagnostic Studies: labs: mild anemia 11.9 from 14.1  Treatments: surgery: see above  Discharge Exam: Blood pressure 95/62, pulse 69, temperature 98.2 F (36.8 C), temperature source Axillary, resp. rate 16, height 5\' 1"  (1.549 m), weight 97 kg (213 lb 14.4 oz), last menstrual period 08/09/2010, SpO2 97 %. General appearance: alert, cooperative and no distress Resp: breathing comfortably GI: soft, non distended, non tender.  wounds c/d/i.   Extremities: extremities normal, atraumatic, no cyanosis or edema  Disposition: Final discharge disposition not confirmed  Discharge Instructions    Call MD for:  persistant nausea and vomiting    Complete by:  As directed    Call MD for:  redness, tenderness, or signs of infection (pain, swelling, redness, odor or green/yellow discharge around incision site)    Complete by:  As directed    Call MD for:  severe uncontrolled pain    Complete  by:  As directed    Call MD for:  temperature >100.4    Complete by:  As directed    Discharge diet:    Complete by:  As directed    Bariatric full liquid diet per protocol   Increase activity slowly    Complete by:  As directed      Allergies as of 02/09/2017      Reactions   Codeine Nausea And Vomiting      Medication List    TAKE these medications   Biotin 5000 MCG Caps Take 5,000 mcg by mouth daily.   citalopram 40 MG tablet Commonly known as:  CELEXA Take 40 mg by mouth daily.   docusate sodium 100 MG capsule Commonly known as:  COLACE Take 1 capsule (100 mg total) by mouth 2 (two) times daily. What changed:  when to take this  reasons to take this   multivitamin with minerals Tabs tablet Take 1 tablet by mouth daily.   oxyCODONE 5 MG/5ML solution Commonly known as:  ROXICODONE Take 5-10 mLs (5-10 mg total) by mouth every 4 (four) hours as needed for moderate pain or severe pain.   protein supplement shake Liqd Commonly known as:  PREMIER PROTEIN Take 59.1 mLs (2 oz total) by mouth every 2 (two) hours.   sulfamethoxazole-trimethoprim 800-160 MG tablet Commonly known as:  BACTRIM DS,SEPTRA DS Take 1 tablet by mouth 2 (two) times daily.   terbinafine 250 MG tablet Commonly known as:  LAMISIL Take 1 tablet (250 mg total) by mouth daily.   Vitamin D (Ergocalciferol) 50000 units Caps capsule Commonly known as:  DRISDOL Take 1 capsule (50,000 Units total) by mouth every 7 (seven) days. Take for 8 total doses(weeks)      Follow-up Information    Luretha Murphy, MD. Go on 02/16/2017.   Specialty:  General Surgery Why:  at [address] information: [address] [place] [address] [phone]        Luretha Murphy, MD Follow up.   Specialty:  General Surgery Contact information: [address] [place] [address] [phone]           Signed: Almond Lint 02/09/2017, 10:29 AM

## 2017-02-09 NOTE — Discharge Instructions (Signed)
° ° ° °GASTRIC BYPASS/SLEEVE ° Home Care Instructions ° ° These instructions are to help you care for yourself when you go home. ° °Call: If you have any problems. °• Call [phone] and ask for the surgeon on call °• If you need immediate assistance come to the ER at [facility]. Tell the ER staff you are a new post-op gastric bypass or gastric sleeve patient  °Signs and symptoms to report: • Severe  vomiting or nausea °o If you cannot handle clear liquids for longer than 1 day, call your surgeon °• Abdominal pain which does not get better after taking your pain medication °• Fever greater than 100.4°  F and chills °• Heart rate over 100 beats a minute °• Trouble breathing °• Chest pain °• Redness,  swelling, drainage, or foul odor at incision (surgical) sites °• If your incisions open or pull apart °• Swelling or pain in calf (lower leg) °• Diarrhea (Loose bowel movements that happen often), frequent watery, uncontrolled bowel movements °• Constipation, (no bowel movements for 3 days) if this happens: °o Take Milk of Magnesia, 2 tablespoons by mouth, 3 times a day for 2 days if needed °o Stop taking Milk of Magnesia once you have had a bowel movement °o Call your doctor if constipation continues °Or °o Take Miralax  (instead of Milk of Magnesia) following the label instructions °o Stop taking Miralax once you have had a bowel movement °o Call your doctor if constipation continues °• Anything you think is “abnormal for you” °  °Normal side effects after surgery: • Unable to sleep at night or unable to concentrate °• Irritability °• Being tearful (crying) or depressed ° °These are common complaints, possibly related to your anesthesia, stress of surgery, and change in lifestyle, that usually go away a few weeks after surgery. If these feelings continue, call your medical doctor.  °Wound Care: You may have surgical glue, steri-strips, or staples over your incisions after surgery °• Surgical glue: Looks like clear  film over your incisions and will wear off a little at a time °• Steri-strips: Adhesive strips of tape over your incisions. You may notice a yellowish color on skin under the steri-strips. This is used to make the steri-strips stick better. Do not pull the steri-strips off - let them fall off °• Staples: Staples may be removed before you leave the hospital °o If you go home with staples, call [facility] for an appointment with your surgeon’s nurse to have staples removed 10 days after surgery, [phone] °• Showering: You may shower two (2) days after your surgery unless your surgeon tells you differently °o Wash gently around incisions with warm soapy water, rinse well, and gently pat dry °o If you have a drain (tube from your incision), you may need someone to hold this while you shower °o No tub baths until staples are removed and incisions are healed °  °Medications: • Medications should be liquid or crushed if larger than the size of a dime °• Extended release pills (medication that releases a little bit at a time through the  day) should not be crushed °• Depending on the size and number of medications you take, you may need to space (take a few throughout the day)/change the time you take your medications so that you do not over-fill your pouch (smaller stomach) °• Make sure you follow-up with you primary care physician to make medication changes needed during rapid weight loss and life -style changes °•   If you have diabetes, follow up with your doctor that orders your diabetes medication(s) within one week after surgery and check your blood sugar regularly ° °• Do not drive while taking narcotics (pain medications) ° °• Do not take acetaminophen (Tylenol) and Roxicet or Lortab Elixir at the same time since these pain medications contain acetaminophen °  °Diet:  °First 2 Weeks You will see the nutritionist about two (2) weeks after your surgery. The nutritionist will increase the types of  foods you can eat if you are handling liquids well: °• If you have severe vomiting or nausea and cannot handle clear liquids lasting longer than 1 day call your surgeon °Protein Shake °• Drink at least 2 ounces of shake 5-6 times per day °• Each serving of protein shakes (usually 8-12 ounces) should have a minimum of: °o 15 grams of protein °o And no more than 5 grams of carbohydrate °• Goal for protein each day: °o Men = 80 grams per day °o Women = 60 grams per day °  ° • Protein powder may be added to fluids such as non-fat milk or Lactaid milk or Soy milk (limit to 35 grams added protein powder per serving) ° °Hydration °• Slowly increase the amount of water and other clear liquids as tolerated (See Acceptable Fluids) °• Slowly increase the amount of protein shake as tolerated °• Sip fluids slowly and throughout the day °• May use sugar substitutes in small amounts (no more than 6-8 packets per day; i.e. Splenda) ° °Fluid Goal °• The first goal is to drink at least 8 ounces of protein shake/drink per day (or as directed by the nutritionist); some examples of protein shakes are Syntrax Nectar, Adkins Advantage, EAS Edge HP, and Unjury. - See handout from pre-op Bariatric Education Class: °o Slowly increase the amount of protein shake you drink as tolerated °o You may find it easier to slowly sip shakes throughout the day °o It is important to get your proteins in first °• Your fluid goal is to drink 64-100 ounces of fluid daily °o It may take a few weeks to build up to this  °• 32 oz. (or more) should be clear liquids °And °• 32 oz. (or more) should be full liquids (see below for examples) °• Liquids should not contain sugar, caffeine, or carbonation ° °Clear Liquids: °• Water of Sugar-free flavored water (i.e. Fruit H²O, Propel) °• Decaffeinated coffee or tea (sugar-free) °• Crystal lite, Wyler’s Lite, Minute Maid Lite °• Sugar-free Jell-O °• Bouillon or broth °• Sugar-free Popsicle:    - Less than 20 calories  each; Limit 1 per day ° °Full Liquids: °                  Protein Shakes/Drinks + 2 choices per day of other full liquids °• Full liquids must be: °o No More Than 12 grams of Carbs per serving °o No More Than 3 grams of Fat per serving °• Strained low-fat cream soup °• Non-Fat milk °• Fat-free Lactaid Milk °• Sugar-free yogurt (Dannon Lite & Fit, Greek yogurt) ° °  °Vitamins and Minerals • Start 1 day after surgery unless otherwise directed by your surgeon °• 2 Chewable Multivitamin / Multimineral Supplement with iron °• Chewable Calcium Citrate with Vitamin D-3 °(Example: 3 Chewable Calcium  Plus 600 with Vitamin D-3) °o Take 500 mg three (3) times a day for a total of 1500 mg each day °o Do not take all 3 doses of calcium at   one time as it may cause constipation, and you can only absorb 500 mg at a time °o Do not mix multivitamins containing iron with calcium supplements;  take 2 hours apart °• Menstruating women and those at risk for anemia ( a blood disease that causes weakness) may need extra iron °o Talk to your doctor to see if you need more iron °• If you need extra iron: Total daily Iron recommendation (including Vitamins) is 50 to 100 mg Iron/day °• Do not stop taking or change any vitamins or minerals until you talk to your nutritionist or surgeon °• Your nutritionist and/or surgeon must approve all vitamin and mineral supplements °  °Activity and Exercise: It is important to continue walking at home. Limit your physical activity as instructed by your doctor. During this time, use these guidelines: °• Do not lift anything greater than ten  (10) pounds for at least two (2) weeks °• Do not go back to work or drive until your surgeon says you can °• You may have sex when you feel comfortable °o It is VERY important for female patients to use a reliable birth control method; fertility often increase after surgery °o Do not get pregnant for at least 18 months °• Start exercising as soon as your doctor tells  you that you can °o Make sure your doctor approves any physical activity °• Start with a simple walking program °• Walk 5-15 minutes each day, 7 days per week °• Slowly increase until you are walking 30-45 minutes per day °• Consider joining our BELT program. [phone] or email [email or web address] °  °Special Instructions Things to remember: ° °• Use your CPAP when sleeping if this applies to you ° °  °  You will likely have your first fill (fluid added to your band) 6 - 8 weeks after surgery °• [facility] has a free Bariatric Surgery Support Group that meets monthly, the 3rd Thursday, 6pm. [facility]. You can see classes online at [email or web address] °• It is very important to keep all follow up appointments with your surgeon, nutritionist, primary care physician, and behavioral health practitioner °o After the first year, please follow up with your bariatric surgeon and nutritionist at least once a year in order to maintain best weight loss results °      °             [facility]:  [phone] ° °             [facility]: [phone] ° °             Bariatric Nurse Coordinator: [phone]  °Gastric Bypass/Sleeve Home Care Instructions  Rev. 09/2012    ° °                                                    Reviewed and Endorsed °                                                   by [facility] Patient Education Committee, Jan, 2014 ° ° ° ° ° ° ° ° ° °

## 2017-02-22 ENCOUNTER — Encounter: Payer: Self-pay | Admitting: Skilled Nursing Facility1

## 2017-02-22 ENCOUNTER — Encounter: Payer: BLUE CROSS/BLUE SHIELD | Attending: Surgery | Admitting: Skilled Nursing Facility1

## 2017-02-22 DIAGNOSIS — Z713 Dietary counseling and surveillance: Secondary | ICD-10-CM | POA: Insufficient documentation

## 2017-02-22 DIAGNOSIS — E669 Obesity, unspecified: Secondary | ICD-10-CM

## 2017-02-22 NOTE — Progress Notes (Signed)
Bariatric Class:  Appt start time: 1530 end time:  1630.  2 Week Post-Operative Nutrition Class  Patient was seen on 02/22/2017 for [facility]ative [facility]   Surgery date: 02/08/2017 Surgery type: Sleeve Gastrectomy  Start weight at [facility]: 209.6 Weight today: 194.4   TANITA  BODY COMP RESULTS  02/22/2017   BMI (kg/m^2) 36.7   Fat Mass (lbs) 90   Fat Free Mass (lbs) 104.4   Total Body Water (lbs) 75   The following the learning objectives were met by the patient during this course:  Identifies Phase 3A (Soft, High Proteins) Dietary Goals and will begin from 2 weeks post-operatively to 2 months post-operatively  Identifies appropriate sources of fluids and proteins   States protein recommendations and appropriate sources post-operatively  Identifies the need for appropriate texture modifications, mastication, and bite sizes when consuming solids  Identifies appropriate multivitamin and calcium sources post-operatively  Describes the need for physical activity post-operatively and will follow MD recommendations  States when to call healthcare provider regarding medication questions or post-operative complications  Handouts given during class include:  Phase 3A: Soft, High Protein Diet Handout  Follow-Up Plan: Patient will follow-up at [facility] in 6 weeks for 2 month post-op nutrition visit for diet advancement per MD.    

## 2017-03-24 ENCOUNTER — Encounter: Payer: Self-pay | Admitting: Sports Medicine

## 2017-04-05 ENCOUNTER — Encounter: Payer: BLUE CROSS/BLUE SHIELD | Attending: Surgery | Admitting: Skilled Nursing Facility1

## 2017-04-05 ENCOUNTER — Encounter: Payer: Self-pay | Admitting: Skilled Nursing Facility1

## 2017-04-05 DIAGNOSIS — E669 Obesity, unspecified: Secondary | ICD-10-CM

## 2017-04-05 DIAGNOSIS — Z713 Dietary counseling and surveillance: Secondary | ICD-10-CM | POA: Diagnosis present

## 2017-04-05 NOTE — Patient Instructions (Addendum)
-  Use a  Straw a get your water in: Aim for minimum 3 of your 18 ounce bottles  -Look for a sugar free fudge pop recipe with some fiber supplement   -Be sure to have at least 1 snack that is a protein snack

## 2017-04-05 NOTE — Progress Notes (Signed)
  Follow-up visit:  8 Weeks Post-Operative Sleeve Gastrectomy Surgery  Primary concerns today: Post-operative Bariatric Surgery Nutrition Management.  Pt states beef has felt too heavy on her stomach. Pt states no foods have made her sick. Pt states she does the protein shake because she takes care of her grandson and it is easy. Pt states she has been feeling pretty tired. Pt states her sister is 8 months out from the RYGB. Pt arrives not meeting her protein or fluid needs.  Surgery date: 02/08/2017 Surgery type: Sleeve Gastrectomy  Start weight at [facility]: 209.6 Weight today: 180.4 Weight Change: 14.4  TANITA  BODY COMP RESULTS  02/22/2017 04/05/2017   BMI (kg/m^2) 36.7 34.1   Fat Mass (lbs) 90 77.6   Fat Free Mass (lbs) 104.4 102.8   Total Body Water (lbs) 75 73.2    24-hr recall: B (AM): premier protein and PB2 (takes 3 hours) Snk (AM): sugar free fudge pop L (PM): 2 ounce of tuna (14g) Snk (PM): sugar free fudge pop D (PM): chicken (14g) Snk (PM):   Fluid intake: 2 protein shakes, water: 18 ounces  Estimated total protein intake: 58  Medications: See  Supplementation: Bariatric advantage and calcium  Using straws: no Drinking while eating: no Having you been chewing well:no Chewing/swallowing difficulties: no Changes in vision: no Changes to mood/headaches: no Hair loss/Changes to skin/Changes to nails: no Any difficulty focusing or concentrating: no Sweating: no Dizziness/Lightheaded: no Palpitations: no  Carbonated beverages: no N/V/D/C/GAS: having a bowel movement once a week  Abdominal Pain: no Dumping syndrome: no  Recent physical activity:  30 minutes 6 days a week   Progress Towards Goal(s):  In progress.    Nutritional Diagnosis:  [place]-3.3 Overweight/obesity related to past poor dietary habits and physical inactivity as evidenced by patient w/ recent sleeve gastrectomy surgery following dietary guidelines for continued weight loss.     Intervention:   Nutrition counseling. Dietitian educated the pt on meeting her fluid and protein needs. Goals: -Use a  Straw a get your water in: Aim for minimum 3 of your 18 ounce bottles -Look for a sugar free fudge pop recipe with some fiber supplement  -Be sure to have at least 1 snack that is a protein snack  Teaching Method Utilized:  Visual Auditory Hands on  Barriers to learning/adherence to lifestyle change: unable to drink water due to "not being a water drinker"  Demonstrated degree of understanding via:  Teach Back   Monitoring/Evaluation:  Dietary intake, exercise, and body weight.

## 2017-04-15 ENCOUNTER — Ambulatory Visit (INDEPENDENT_AMBULATORY_CARE_PROVIDER_SITE_OTHER): Payer: BLUE CROSS/BLUE SHIELD | Admitting: Sports Medicine

## 2017-04-15 ENCOUNTER — Encounter: Payer: Self-pay | Admitting: Sports Medicine

## 2017-04-15 ENCOUNTER — Ambulatory Visit (INDEPENDENT_AMBULATORY_CARE_PROVIDER_SITE_OTHER): Payer: BLUE CROSS/BLUE SHIELD

## 2017-04-15 DIAGNOSIS — M79604 Pain in right leg: Secondary | ICD-10-CM | POA: Insufficient documentation

## 2017-04-15 DIAGNOSIS — M545 Low back pain, unspecified: Secondary | ICD-10-CM

## 2017-04-15 DIAGNOSIS — M79605 Pain in left leg: Secondary | ICD-10-CM | POA: Insufficient documentation

## 2017-04-15 MED ORDER — MELOXICAM 7.5 MG PO TABS: ORAL_TABLET | ORAL | 3 refills | Status: DC

## 2017-04-15 MED ORDER — PREDNISONE 50 MG PO TABS: ORAL_TABLET | ORAL | 0 refills | Status: DC

## 2017-04-15 NOTE — Assessment & Plan Note (Signed)
Nothing overtly radicular. Axial back pain, discogenic. Lumbar spine x-rays, prednisone, low-dose meloxicam. NSAIDs are acceptable after sleeve gastrectomy at low doses. Formal physical therapy.  Return to see me in one month, her for interventional planning if no better.

## 2017-04-15 NOTE — Progress Notes (Signed)
  Subjective:    CC: Back pain  HPI: Kiely's here to discuss back pain, she tells me for him tired life she's had pain in the low back with radiation to both hips, moderate, persistent, worse with every motion, no trauma, no constitutional symptoms, no bowel or bladder dysfunction, occasional radiation down the back of both legs but not to the feet or toes.   Past medical history:  Negative.  See flowsheet/record as well for more information.  Surgical history: Negative.  See flowsheet/record as well for more information.  Family history: Negative.  See flowsheet/record as well for more information.  Social history: Negative.  See flowsheet/record as well for more information.  Allergies, and medications have been entered into the medical record, reviewed, and no changes needed.   Review of Systems: No fevers, chills, night sweats, weight loss, chest pain, or shortness of breath.   Objective:    General: Well Developed, well nourished, and in no acute distress.  Neuro: Alert and oriented x3, extra-ocular muscles intact, sensation grossly intact.  HEENT: Normocephalic, atraumatic, pupils equal round reactive to light, neck supple, no masses, no lymphadenopathy, thyroid nonpalpable.  Skin: Warm and dry, no rashes. Cardiac: Regular rate and rhythm, no murmurs rubs or gallops, no lower extremity edema.  Respiratory: Clear to auscultation bilaterally. Not using accessory muscles, speaking in full sentences. Back Exam:  Inspection: Unremarkable  Motion: Flexion 45 deg, Extension 45 deg, Side Bending to 45 deg bilaterally,  Rotation to 45 deg bilaterally  SLR laying: Negative  XSLR laying: Negative  Palpable tenderness: None. FABER: negative. Sensory change: Gross sensation intact to all lumbar and sacral dermatomes.  Reflexes: 2+ at both patellar tendons, 2+ at achilles tendons, Babinski's downgoing.  Strength at foot  Plantar-flexion: 5/5 Dorsi-flexion: 5/5 Eversion: 5/5 Inversion:  5/5  Leg strength  Quad: 5/5 Hamstring: 5/5 Hip flexor: 5/5 Hip abductors: 5/5  Gait unremarkable.  Impression and Recommendations:    Low back pain radiating to both legs Nothing overtly radicular. Axial back pain, discogenic. Lumbar spine x-rays, prednisone, low-dose meloxicam. NSAIDs are acceptable after sleeve gastrectomy at low doses. Formal physical therapy.  Return to see me in one month, her for interventional planning if no better.  ___________________________________________ Ihor Austinhomas J. Benjamin Stainhekkekandam, M.D., ABFM., CAQSM. Primary Care and Sports Medicine [facility]  Adjunct Instructor of Family Medicine  [employer]

## 2017-05-09 ENCOUNTER — Ambulatory Visit: Payer: BLUE CROSS/BLUE SHIELD | Admitting: Skilled Nursing Facility1

## 2017-05-13 ENCOUNTER — Encounter: Payer: Self-pay | Admitting: Sports Medicine

## 2017-05-13 ENCOUNTER — Ambulatory Visit (INDEPENDENT_AMBULATORY_CARE_PROVIDER_SITE_OTHER): Payer: BLUE CROSS/BLUE SHIELD | Admitting: Sports Medicine

## 2017-05-13 VITALS — BP 94/63 | HR 73 | Ht 61.75 in | Wt 172.0 lb

## 2017-05-13 DIAGNOSIS — Z9884 Bariatric surgery status: Secondary | ICD-10-CM | POA: Diagnosis not present

## 2017-05-13 DIAGNOSIS — M545 Low back pain: Secondary | ICD-10-CM | POA: Diagnosis not present

## 2017-05-13 DIAGNOSIS — F5101 Primary insomnia: Secondary | ICD-10-CM | POA: Diagnosis not present

## 2017-05-13 DIAGNOSIS — G4700 Insomnia, unspecified: Secondary | ICD-10-CM | POA: Insufficient documentation

## 2017-05-13 DIAGNOSIS — M79605 Pain in left leg: Secondary | ICD-10-CM

## 2017-05-13 DIAGNOSIS — Z23 Encounter for immunization: Secondary | ICD-10-CM

## 2017-05-13 DIAGNOSIS — M79604 Pain in right leg: Secondary | ICD-10-CM

## 2017-05-13 MED ORDER — DIPHENHYDRAMINE-APAP (SLEEP) 25-500 MG PO TABS: 1.0000 | ORAL_TABLET | Freq: Every evening | ORAL | Status: DC | PRN

## 2017-05-13 NOTE — Assessment & Plan Note (Signed)
Good sleep hygiene, multiple awakening through the night, normal sleep latency. She will simply try Tylenol PM for now.

## 2017-05-13 NOTE — Progress Notes (Signed)
  Subjective:    CC: Low back pain  HPI: Rita Bentley returns, she has known degenerative disc disease, she had some back pain at the last visit. I recommended prednisone, physical therapy, she did prednisone, never did any physical therapy, still has some back pain. No bowel or bladder dysfunction, saddle numbness, constitutional symptoms.  Insomnia: Normal sleep latency, tends to wake up through the night with racing thoughts. No other issues with sleep hygiene. Would like an over-the-counter recommendation.  Sleep gastrectomy: Desires to have her routine blood work including vitamin levels checked.  Past medical history:  Negative.  See flowsheet/record as well for more information.  Surgical history: Negative.  See flowsheet/record as well for more information.  Family history: Negative.  See flowsheet/record as well for more information.  Social history: Negative.  See flowsheet/record as well for more information.  Allergies, and medications have been entered into the medical record, reviewed, and no changes needed.   Review of Systems: No fevers, chills, night sweats, weight loss, chest pain, or shortness of breath.   Objective:    General: Well Developed, well nourished, and in no acute distress.  Neuro: Alert and oriented x3, extra-ocular muscles intact, sensation grossly intact.  HEENT: Normocephalic, atraumatic, pupils equal round reactive to light, neck supple, no masses, no lymphadenopathy, thyroid nonpalpable.  Skin: Warm and dry, no rashes. Cardiac: Regular rate and rhythm, no murmurs rubs or gallops, no lower extremity edema.  Respiratory: Clear to auscultation bilaterally. Not using accessory muscles, speaking in full sentences.  Impression and Recommendations:    Low back pain radiating to both legs Slight improvement, never did physical therapy. Adding home rehabilitation exercises, return in a month.  Insomnia Good sleep hygiene, multiple awakening through the  night, normal sleep latency. She will simply try Tylenol PM for now.  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 Typically there is not a great deal of B12 deficiency with sleeve gastrectomy.  ___________________________________________ Ihor Austin. Rita Bentley, M.D., ABFM., CAQSM. Primary Care and Sports Medicine [facility]  Adjunct Instructor of Family Medicine  [employer]

## 2017-05-13 NOTE — Assessment & Plan Note (Signed)
Fantastic weight loss, checking routine blood work. Typically there is not a great deal of B12 deficiency with sleeve gastrectomy.

## 2017-05-13 NOTE — Assessment & Plan Note (Signed)
Slight improvement, never did physical therapy. Adding home rehabilitation exercises, return in a month.

## 2017-05-14 LAB — HEMOGLOBIN A1C
Hgb A1c MFr Bld: 5.1 % of total Hgb (ref <5.7)
Mean Plasma Glucose: 100 (calc)
eAG (mmol/L): 5.5 (calc)

## 2017-05-14 LAB — CBC
HCT: 39.7 % (ref 35.0–45.0)
Hemoglobin: 13.1 g/dL (ref 11.7–15.5)
MCH: 29.2 pg (ref 27.0–33.0)
MCHC: 33 g/dL (ref 32.0–36.0)
MCV: 88.4 fL (ref 80.0–100.0)
MPV: 10.6 fL (ref 7.5–12.5)
Platelets: 320 10*3/uL (ref 140–400)
RBC: 4.49 10*6/uL (ref 3.80–5.10)
RDW: 13.7 % (ref 11.0–15.0)
WBC: 8.6 Thousand/uL (ref 3.8–10.8)

## 2017-05-14 LAB — COMPREHENSIVE METABOLIC PANEL WITH GFR
AG Ratio: 1.9 (calc) (ref 1.0–2.5)
ALT: 13 U/L (ref 6–29)
AST: 14 U/L (ref 10–30)
Alkaline phosphatase (APISO): 51 U/L (ref 33–115)
CO2: 29 mmol/L (ref 20–32)
Calcium: 9.7 mg/dL (ref 8.6–10.2)
Sodium: 141 mmol/L (ref 135–146)
Total Bilirubin: 0.4 mg/dL (ref 0.2–1.2)
Total Protein: 6.6 g/dL (ref 6.1–8.1)

## 2017-05-14 LAB — LIPID PANEL W/REFLEX DIRECT LDL
Cholesterol: 161 mg/dL (ref <200)
HDL: 40 mg/dL — ABNORMAL LOW (ref >50)
LDL Cholesterol (Calc): 100 mg/dL — ABNORMAL HIGH
Non-HDL Cholesterol (Calc): 121 mg/dL (ref <130)
Total CHOL/HDL Ratio: 4 (calc) (ref <5.0)
Triglycerides: 118 mg/dL (ref <150)

## 2017-05-14 LAB — COMPREHENSIVE METABOLIC PANEL
Albumin: 4.3 g/dL (ref 3.6–5.1)
BUN: 20 mg/dL (ref 7–25)
Chloride: 103 mmol/L (ref 98–110)
Creat: 0.64 mg/dL (ref 0.50–1.10)
Globulin: 2.3 g/dL (calc) (ref 1.9–3.7)
Glucose, Bld: 91 mg/dL (ref 65–99)
Potassium: 3.9 mmol/L (ref 3.5–5.3)

## 2017-05-14 LAB — VITAMIN B12: Vitamin B-12: 718 pg/mL (ref 200–1100)

## 2017-05-14 LAB — VITAMIN D 25 HYDROXY (VIT D DEFICIENCY, FRACTURES): Vit D, 25-Hydroxy: 32 ng/mL (ref 30–100)

## 2017-05-16 ENCOUNTER — Encounter: Payer: Self-pay | Admitting: Skilled Nursing Facility1

## 2017-05-16 ENCOUNTER — Encounter: Payer: BLUE CROSS/BLUE SHIELD | Attending: Surgery | Admitting: Skilled Nursing Facility1

## 2017-05-16 DIAGNOSIS — E669 Obesity, unspecified: Secondary | ICD-10-CM

## 2017-05-16 DIAGNOSIS — Z713 Dietary counseling and surveillance: Secondary | ICD-10-CM | POA: Diagnosis not present

## 2017-05-16 NOTE — Progress Notes (Signed)
  Follow-up visit:  8 Weeks Post-Operative Sleeve Gastrectomy Surgery  Primary concerns today: Post-operative Bariatric Surgery Nutrition Management.  Pt states using a straw has helped her to get in more fluid. Pt states nothing makes her sick but the thought of eating meat makes her really not want it: chicken, fish, hamburger meat, [place] meat, pork. Pt states she is still not having any hunger pains.    Surgery date: 02/08/2017 Surgery type: Sleeve Gastrectomy  Start weight at [facility]: 209.6 Weight today: 170.2 Weight Change: 10  TANITA  BODY COMP RESULTS  02/22/2017 04/05/2017 05/16/2017   BMI (kg/m^2) 36.7 34.1 32.2   Fat Mass (lbs) 90 77.6 69.4   Fat Free Mass (lbs) 104.4 102.8 100.8   Total Body Water (lbs) 75 73.2 71.4    24-hr recall:  B (AM): premier protein and PB2 and miralax (takes 3 hours) Snk (AM): beef jerky while driving L (PM): chicken [id number]) Snk (PM):  D (PM): chicken (14g) Snk (PM):   Fluid intake: 2 protein shakes, water: 50 ounces  Estimated total protein intake: 50  Medications: Meloxicam for arthritis  Supplementation: Bariatric advantage and calcium  Using straws: no Drinking while eating: no Having you been chewing well:no Chewing/swallowing difficulties: no Changes in vision: no Changes to mood/headaches: no Hair loss/Changes to skin/Changes to nails: no Any difficulty focusing or concentrating: no Sweating: no Dizziness/Lightheaded: no Palpitations: no  Carbonated beverages: no N/V/D/C/GAS: having a bowel movement once a week  Abdominal Pain: no Dumping syndrome: no  Recent physical activity:  30-45 minutes 6 days a week   Progress Towards Goal(s):  In progress.    Nutritional Diagnosis:  [place]-3.3 Overweight/obesity related to past poor dietary habits and physical inactivity as evidenced by patient w/ recent sleeve gastrectomy surgery following dietary guidelines for continued weight loss.     Intervention:  Nutrition counseling.  Dietitian educated the pt on meeting her fluid and protein needs through vegetarian protein options. Goals: -Keep aiming for a fourth bottle of water -Chew your food until applesauce consistency -Balance is important with your food -Try kefir in your smoothie Teaching Method Utilized:  Visual Auditory Hands on  Barriers to learning/adherence to lifestyle change: unable to drink water due to "not being a water drinker"  Demonstrated degree of understanding via:  Teach Back   Monitoring/Evaluation:  Dietary intake, exercise, and body weight.

## 2017-05-16 NOTE — Patient Instructions (Addendum)
-  Keep aiming for a fourth bottle of water  -Chew your food until applesauce consistency  -Balance is important with your food  -Try kefir in your smoothie

## 2017-06-10 ENCOUNTER — Ambulatory Visit: Payer: BLUE CROSS/BLUE SHIELD | Admitting: Sports Medicine

## 2017-06-10 DIAGNOSIS — Z0189 Encounter for other specified special examinations: Secondary | ICD-10-CM

## 2017-06-13 ENCOUNTER — Encounter: Payer: Self-pay | Admitting: Sports Medicine

## 2017-06-17 ENCOUNTER — Ambulatory Visit: Payer: BLUE CROSS/BLUE SHIELD | Admitting: Sports Medicine

## 2017-06-17 VITALS — BP 110/77 | HR 74 | Resp 16 | Wt 166.0 lb

## 2017-06-17 DIAGNOSIS — T3695XA Adverse effect of unspecified systemic antibiotic, initial encounter: Secondary | ICD-10-CM | POA: Diagnosis not present

## 2017-06-17 DIAGNOSIS — R300 Dysuria: Secondary | ICD-10-CM | POA: Diagnosis not present

## 2017-06-17 DIAGNOSIS — N12 Tubulo-interstitial nephritis, not specified as acute or chronic: Secondary | ICD-10-CM | POA: Insufficient documentation

## 2017-06-17 DIAGNOSIS — K521 Toxic gastroenteritis and colitis: Secondary | ICD-10-CM

## 2017-06-17 NOTE — Progress Notes (Signed)
  Subjective:    CC: Hospital follow-up  HPI: 5 days ago this pleasant 46 year old female went to the emergency department after having severe abdominal pain, right-sided, radiating to the right flank with urgency, frequency and constitutional symptoms, fatigue, fevers.  In the emergency department she had a urinalysis which was positive for blood, leukocytes, CT of the abdomen and pelvis showed right perinephric stranding consistent with pyelonephritis, she was treated with a dose of ceftriaxone, and sent home with Keflex, no urine culture was obtained.  She started to feel a little bit better but then developed some diarrhea, multiple times today with an abnormal smell.  She still has a little bit of suprapubic discomfort.  Past medical history:  Negative.  See flowsheet/record as well for more information.  Surgical history: Negative.  See flowsheet/record as well for more information.  Family history: Negative.  See flowsheet/record as well for more information.  Social history: Negative.  See flowsheet/record as well for more information.  Allergies, and medications have been entered into the medical record, reviewed, and no changes needed.   Review of Systems: No fevers, chills, night sweats, weight loss, chest pain, or shortness of breath.   Objective:    General: Well Developed, well nourished, and in no acute distress.  Neuro: Alert and oriented x3, extra-ocular muscles intact, sensation grossly intact.  HEENT: Normocephalic, atraumatic, pupils equal round reactive to light, neck supple, no masses, no lymphadenopathy, thyroid nonpalpable.  Skin: Warm and dry, no rashes. Cardiac: Regular rate and rhythm, no murmurs rubs or gallops, no lower extremity edema.  Respiratory: Clear to auscultation bilaterally. Not using accessory muscles, speaking in full sentences. Abdomen: Soft, tender to palpation of the epigastrium, as well as the suprapubic region.  Nondistended, no palpable masses, no  guarding, rigidity, rebound pain, no costovertebral angle pain.  Patient was only able to produce enough urine to send a culture.  Impression and Recommendations:    Antibiotic-associated diarrhea Sending off stool studies for C. Difficile. If positive we will use metronidazole.  I spent 25 minutes with this patient, greater than 50% was face-to-face time counseling regarding the above diagnoses ___________________________________________ Ihor Austinhomas J. Benjamin Stainhekkekandam, M.D., ABFM., CAQSM. Primary Care and Sports Medicine [facility]  Adjunct Instructor of Family Medicine  [employer]

## 2017-06-17 NOTE — Assessment & Plan Note (Signed)
Sending off stool studies for C. Difficile. If positive we will use metronidazole.

## 2017-06-18 LAB — URINE CULTURE
MICRO NUMBER:: 81265802
SPECIMEN QUALITY:: ADEQUATE

## 2017-09-19 ENCOUNTER — Ambulatory Visit: Payer: BLUE CROSS/BLUE SHIELD | Admitting: Skilled Nursing Facility1

## 2017-11-18 ENCOUNTER — Other Ambulatory Visit: Payer: Self-pay | Admitting: Sports Medicine

## 2017-11-18 DIAGNOSIS — F419 Anxiety disorder, unspecified: Principal | ICD-10-CM

## 2017-11-18 DIAGNOSIS — F329 Major depressive disorder, single episode, unspecified: Secondary | ICD-10-CM

## 2017-11-21 ENCOUNTER — Encounter: Payer: Self-pay | Admitting: Sports Medicine

## 2017-11-21 ENCOUNTER — Ambulatory Visit: Payer: BLUE CROSS/BLUE SHIELD | Admitting: Sports Medicine

## 2017-11-21 VITALS — BP 98/64 | HR 72 | Resp 16 | Wt 157.0 lb

## 2017-11-21 DIAGNOSIS — K219 Gastro-esophageal reflux disease without esophagitis: Secondary | ICD-10-CM

## 2017-11-21 DIAGNOSIS — F329 Major depressive disorder, single episode, unspecified: Secondary | ICD-10-CM

## 2017-11-21 DIAGNOSIS — Z9884 Bariatric surgery status: Secondary | ICD-10-CM

## 2017-11-21 DIAGNOSIS — F419 Anxiety disorder, unspecified: Secondary | ICD-10-CM

## 2017-11-21 DIAGNOSIS — F32A Depression, unspecified: Secondary | ICD-10-CM

## 2017-11-21 DIAGNOSIS — Z23 Encounter for immunization: Secondary | ICD-10-CM

## 2017-11-21 MED ORDER — DEXLANSOPRAZOLE 60 MG PO CPDR: 60.0000 mg | DELAYED_RELEASE_CAPSULE | Freq: Every day | ORAL | 0 refills | Status: DC

## 2017-11-21 MED ORDER — PHENTERMINE HCL 37.5 MG PO TABS
ORAL_TABLET | ORAL | 0 refills | Status: DC
Start: 2017-11-21 — End: 2017-12-19

## 2017-11-21 MED ORDER — CITALOPRAM HYDROBROMIDE 10 MG PO TABS: 10.0000 mg | ORAL_TABLET | Freq: Every day | ORAL | 0 refills | Status: DC

## 2017-11-21 MED ORDER — DEXLANSOPRAZOLE 60 MG PO CPDR: 60.0000 mg | DELAYED_RELEASE_CAPSULE | Freq: Every day | ORAL | 11 refills | Status: DC

## 2017-11-21 MED ORDER — MELOXICAM 15 MG PO TABS: ORAL_TABLET | ORAL | 3 refills | Status: DC

## 2017-11-21 NOTE — Progress Notes (Signed)
Subjective:    CC: Multiple issues  HPI: Obesity: 60 pound weight loss with sleeve gastrectomy, she has somewhat plateaued and is wondering if she could go to phentermine.  Anxiety depression: Was well controlled on 40 mg of Celexa historically, during her gastric sleeve.  Of time she went off of her anxiety medications and would like to restart.  No suicidal or homicidal ideation.  Widespread pains/GERD: Only on 7.5 of meloxicam, no adverse effects.  She does have some GERD, and would like to go up to 15 mg.  She has not had much improvement with pantoprazole, Nexium, omeprazole.  I reviewed the past medical history, family history, social history, surgical history, and allergies today and no changes were needed.  Please see the problem list section below in epic for further details.  Past Medical History: Past Medical History:  Diagnosis Date  . Anxiety   . Arthritis   . Complication of anesthesia    per patient had to be encourgaged multiple times to breathe deeply; ; likely low O2 saturation ; states " i think i was still just too drugged up   . Depression   . GERD (gastroesophageal reflux disease)   . History of kidney stones   . Sleep apnea    no CPAP use yet ; hasnt gotten it    Past Surgical History: Past Surgical History:  Procedure Laterality Date  . CHOLECYSTECTOMY  09/10/1995  . LAPAROSCOPIC GASTRIC SLEEVE RESECTION N/A 02/08/2017   Procedure: LAPAROSCOPIC GASTRIC SLEEVE RESECTION, UPPER ENDO;  Surgeon: Luretha Murphy, MD;  Location: [facility];  Service: General;  Laterality: N/A;  . TONSILLECTOMY    . TOTAL VAGINAL HYSTERECTOMY  06/10/2011  . TUBAL LIGATION  03/10/1999   Social History: Social History   Socioeconomic History  . Marital status: Married    Spouse name: Not on file  . Number of children: Not on file  . Years of education: Not on file  . Highest education level: Not on file  Occupational History  . Not on file  Social Needs  . Financial resource  strain: Not on file  . Food insecurity:    Worry: Not on file    Inability: Not on file  . Transportation needs:    Medical: Not on file    Non-medical: Not on file  Tobacco Use  . Smoking status: Former Smoker    Packs/day: 1.00    Years: 26.00    Pack years: 26.00    Types: Cigarettes    Start date: 07/19/1990    Last attempt to quit: 11/25/2016    Years since quitting: 0.9  . Smokeless tobacco: Never Used  Substance and Sexual Activity  . Alcohol use: Yes    Alcohol/week: 1.8 oz    Types: 3 Standard drinks or equivalent per week  . Drug use: No  . Sexual activity: Yes    Partners: Female    Birth control/protection: Other-see comments  Lifestyle  . Physical activity:    Days per week: Not on file    Minutes per session: Not on file  . Stress: Not on file  Relationships  . Social connections:    Talks on phone: Not on file    Gets together: Not on file    Attends religious service: Not on file    Active member of club or organization: Not on file    Attends meetings of clubs or organizations: Not on file    Relationship status: Not on file  Other Topics  Concern  . Not on file  Social History Narrative  . Not on file   Family History: Family History  Problem Relation Age of Onset  . Hyperlipidemia Mother   . Hypertension Mother   . Hyperlipidemia Father   . Hypertension Father   . Cancer Sister        Colon  . Cancer Maternal Grandmother   . Cancer Paternal Grandmother    Allergies: Allergies  Allergen Reactions  . Codeine Nausea And Vomiting   Medications: See med rec.  Review of Systems: No fevers, chills, night sweats, weight loss, chest pain, or shortness of breath.   Objective:    General: Well Developed, well nourished, and in no acute distress.  Neuro: Alert and oriented x3, extra-ocular muscles intact, sensation grossly intact.  HEENT: Normocephalic, atraumatic, pupils equal round reactive to light, neck supple, no masses, no  lymphadenopathy, thyroid nonpalpable.  Skin: Warm and dry, no rashes. Cardiac: Regular rate and rhythm, no murmurs rubs or gallops, no lower extremity edema.  Respiratory: Clear to auscultation bilaterally. Not using accessory muscles, speaking in full sentences.  Impression and Recommendations:    Anxiety and depression Restarting Celexa, 10 mg daily for a week then one half of her 40 mg tabs. 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 Return monthly for weight checks and refills.  GERD (gastroesophageal reflux disease) Has failed multiple PPIs, omeprazole, pantoprazole, esomeprazole. Starting Dexilant.  I spent 40 minutes with this patient, greater than 50% was face-to-face time counseling regarding the above diagnoses ___________________________________________ Ihor Austinhomas J. Benjamin Stainhekkekandam, M.D., ABFM., CAQSM. Primary Care and Sports Medicine [facility]  Adjunct Instructor of Family Medicine  [employer]

## 2017-11-21 NOTE — Assessment & Plan Note (Signed)
Good weight loss, she does desire some additional assistance with losing weight, restarting phentermine, she does need a bit more meloxicam, restarting this as well. Return monthly for weight checks and refills.

## 2017-11-21 NOTE — Assessment & Plan Note (Signed)
Has failed multiple PPIs, omeprazole, pantoprazole, esomeprazole. Starting Dexilant.

## 2017-11-21 NOTE — Assessment & Plan Note (Signed)
Restarting Celexa, 10 mg daily for a week then one half of her 40 mg tabs. Return in 1 month for PHQ/GAD

## 2017-12-19 ENCOUNTER — Ambulatory Visit: Payer: BLUE CROSS/BLUE SHIELD | Admitting: Sports Medicine

## 2017-12-19 ENCOUNTER — Encounter: Payer: Self-pay | Admitting: Sports Medicine

## 2017-12-19 DIAGNOSIS — F329 Major depressive disorder, single episode, unspecified: Secondary | ICD-10-CM

## 2017-12-19 DIAGNOSIS — F32A Depression, unspecified: Secondary | ICD-10-CM

## 2017-12-19 DIAGNOSIS — F419 Anxiety disorder, unspecified: Secondary | ICD-10-CM | POA: Diagnosis not present

## 2017-12-19 DIAGNOSIS — K219 Gastro-esophageal reflux disease without esophagitis: Secondary | ICD-10-CM

## 2017-12-19 DIAGNOSIS — F5101 Primary insomnia: Secondary | ICD-10-CM

## 2017-12-19 DIAGNOSIS — Z9884 Bariatric surgery status: Secondary | ICD-10-CM | POA: Diagnosis not present

## 2017-12-19 MED ORDER — PHENTERMINE HCL 37.5 MG PO TABS: ORAL_TABLET | ORAL | 0 refills | Status: DC

## 2017-12-19 MED ORDER — OMEPRAZOLE 40 MG PO CPDR: 40.0000 mg | DELAYED_RELEASE_CAPSULE | Freq: Two times a day (BID) | ORAL | 11 refills | Status: DC

## 2017-12-19 MED ORDER — TRAZODONE HCL 50 MG PO TABS: 50.0000 mg | ORAL_TABLET | Freq: Every day | ORAL | 1 refills | Status: DC

## 2017-12-19 NOTE — Assessment & Plan Note (Signed)
Additional 7 pound weight loss, refilling phentermine, entering the second month.

## 2017-12-19 NOTE — Progress Notes (Signed)
Subjective:    CC: Follow-up  HPI: Obesity: Good weight loss.  Depression: Improved considerably with Celexa low-dose.  Unfortunately she is having some difficulty sleeping, she is able to get to sleep but then wakes up and then starts perseverating about issues during the day.  GERD: Never took Dexilant, was too expensive.  No melena, hematochezia, hematemesis.  I reviewed the past medical history, family history, social history, surgical history, and allergies today and no changes were needed.  Please see the problem list section below in epic for further details.  Past Medical History: Past Medical History:  Diagnosis Date  . Anxiety   . Arthritis   . Complication of anesthesia    per patient had to be encourgaged multiple times to breathe deeply; ; likely low O2 saturation ; states " i think i was still just too drugged up   . Depression   . GERD (gastroesophageal reflux disease)   . History of kidney stones   . Sleep apnea    no CPAP use yet ; hasnt gotten it    Past Surgical History: Past Surgical History:  Procedure Laterality Date  . CHOLECYSTECTOMY  09/10/1995  . LAPAROSCOPIC GASTRIC SLEEVE RESECTION N/A 02/08/2017   Procedure: LAPAROSCOPIC GASTRIC SLEEVE RESECTION, UPPER ENDO;  Surgeon: Luretha Murphy, MD;  Location: [facility];  Service: General;  Laterality: N/A;  . TONSILLECTOMY    . TOTAL VAGINAL HYSTERECTOMY  06/10/2011  . TUBAL LIGATION  03/10/1999   Social History: Social History   Socioeconomic History  . Marital status: Married    Spouse name: Not on file  . Number of children: Not on file  . Years of education: Not on file  . Highest education level: Not on file  Occupational History  . Not on file  Social Needs  . Financial resource strain: Not on file  . Food insecurity:    Worry: Not on file    Inability: Not on file  . Transportation needs:    Medical: Not on file    Non-medical: Not on file  Tobacco Use  . Smoking status: Former Smoker   Packs/day: 1.00    Years: 26.00    Pack years: 26.00    Types: Cigarettes    Start date: 07/19/1990    Last attempt to quit: 11/25/2016    Years since quitting: 1.0  . Smokeless tobacco: Never Used  Substance and Sexual Activity  . Alcohol use: Yes    Alcohol/week: 1.8 oz    Types: 3 Standard drinks or equivalent per week  . Drug use: No  . Sexual activity: Yes    Partners: Female    Birth control/protection: Other-see comments  Lifestyle  . Physical activity:    Days per week: Not on file    Minutes per session: Not on file  . Stress: Not on file  Relationships  . Social connections:    Talks on phone: Not on file    Gets together: Not on file    Attends religious service: Not on file    Active member of club or organization: Not on file    Attends meetings of clubs or organizations: Not on file    Relationship status: Not on file  Other Topics Concern  . Not on file  Social History Narrative  . Not on file   Family History: Family History  Problem Relation Age of Onset  . Hyperlipidemia Mother   . Hypertension Mother   . Hyperlipidemia Father   . Hypertension Father   .  Cancer Sister        Colon  . Cancer Maternal Grandmother   . Cancer Paternal Grandmother    Allergies: Allergies  Allergen Reactions  . Codeine Nausea And Vomiting   Medications: See med rec.  Review of Systems: No fevers, chills, night sweats, weight loss, chest pain, or shortness of breath.   Objective:    General: Well Developed, well nourished, and in no acute distress.  Neuro: Alert and oriented x3, extra-ocular muscles intact, sensation grossly intact.  HEENT: Normocephalic, atraumatic, pupils equal round reactive to light, neck supple, no masses, no lymphadenopathy, thyroid nonpalpable.  Skin: Warm and dry, no rashes. Cardiac: Regular rate and rhythm, no murmurs rubs or gallops, no lower extremity edema.  Respiratory: Clear to auscultation bilaterally. Not using accessory muscles,  speaking in full sentences.  Impression and Recommendations:    Anxiety and depression Anxiety and depressive symptoms have improved on 10 of Celexa, still having significant difficulty sleeping, she can get to sleep but then wakes up in the middle of the night. I think trazodone would be a good remedy for this, starting at 50 mg, we will taper this up if needed.  GERD (gastroesophageal reflux disease) Dexilant was too expensive, has tried multiple PPIs, including Protonix and Nexium. Only doing omeprazole once a day, he does only have a 10 to 12-hour duration of efficacy, increasing omeprazole to twice a day before considering ranitidine 300 twice a day in addition.  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  Insomnia See above, adding trazodone, good sleep hygiene.  Tylenol PM was ineffective.   ___________________________________________ Ihor Austin. Benjamin Stain, M.D., ABFM., CAQSM. Primary Care and Sports Medicine [facility]  Adjunct Instructor of Family Medicine  [employer]

## 2017-12-19 NOTE — Assessment & Plan Note (Signed)
See above, adding trazodone, good sleep hygiene.  Tylenol PM was ineffective.

## 2017-12-19 NOTE — Assessment & Plan Note (Signed)
Anxiety and depressive symptoms have improved on 10 of Celexa, still having significant difficulty sleeping, she can get to sleep but then wakes up in the middle of the night. I think trazodone would be a good remedy for this, starting at 50 mg, we will taper this up if needed.

## 2017-12-19 NOTE — Assessment & Plan Note (Signed)
Dexilant was too expensive, has tried multiple PPIs, including Protonix and Nexium. Only doing omeprazole once a day, he does only have a 10 to 12-hour duration of efficacy, increasing omeprazole to twice a day before considering ranitidine 300 twice a day in addition.

## 2017-12-20 ENCOUNTER — Encounter: Payer: Self-pay | Admitting: Sports Medicine

## 2018-01-11 ENCOUNTER — Other Ambulatory Visit: Payer: Self-pay | Admitting: Sports Medicine

## 2018-01-11 DIAGNOSIS — F32A Depression, unspecified: Secondary | ICD-10-CM

## 2018-01-11 DIAGNOSIS — F329 Major depressive disorder, single episode, unspecified: Secondary | ICD-10-CM

## 2018-01-11 DIAGNOSIS — F419 Anxiety disorder, unspecified: Principal | ICD-10-CM

## 2018-01-16 ENCOUNTER — Encounter: Payer: Self-pay | Admitting: Sports Medicine

## 2018-01-16 ENCOUNTER — Ambulatory Visit (INDEPENDENT_AMBULATORY_CARE_PROVIDER_SITE_OTHER): Payer: BLUE CROSS/BLUE SHIELD | Admitting: Sports Medicine

## 2018-01-16 DIAGNOSIS — F419 Anxiety disorder, unspecified: Secondary | ICD-10-CM

## 2018-01-16 DIAGNOSIS — F329 Major depressive disorder, single episode, unspecified: Secondary | ICD-10-CM

## 2018-01-16 DIAGNOSIS — K219 Gastro-esophageal reflux disease without esophagitis: Secondary | ICD-10-CM

## 2018-01-16 DIAGNOSIS — F32A Depression, unspecified: Secondary | ICD-10-CM

## 2018-01-16 DIAGNOSIS — Z9884 Bariatric surgery status: Secondary | ICD-10-CM | POA: Diagnosis not present

## 2018-01-16 MED ORDER — TRAZODONE HCL 100 MG PO TABS
100.0000 mg | ORAL_TABLET | Freq: Every day | ORAL | 3 refills | Status: DC
Start: 2018-01-16 — End: 2018-02-07

## 2018-01-16 MED ORDER — PHENTERMINE HCL 37.5 MG PO TABS: ORAL_TABLET | ORAL | 0 refills | Status: DC

## 2018-01-16 NOTE — Assessment & Plan Note (Signed)
Omeprazole twice a day has been effective. Next step would be adding ranitidine 300 twice a day but symptoms are well controlled.

## 2018-01-16 NOTE — Assessment & Plan Note (Signed)
Additional 5 pound weight loss, refilling phentermine, entering the third month.

## 2018-01-16 NOTE — Assessment & Plan Note (Signed)
Continue Celexa 10, we started trazodone 50 at bedtime, she is done well but notices some waning efficacy, increasing to 100 mg at night.

## 2018-01-16 NOTE — Progress Notes (Signed)
Subjective:    CC: Weight check  HPI: Obesity: Additional 5 pound weight loss, entering the third month.  Insomnia: Much better with 50 of trazodone, noticing some weaning efficacy.  GERD: Much better with switching omeprazole to twice a day.  I reviewed the past medical history, family history, social history, surgical history, and allergies today and no changes were needed.  Please see the problem list section below in epic for further details.  Past Medical History: Past Medical History:  Diagnosis Date  . Anxiety   . Arthritis   . Complication of anesthesia    per patient had to be encourgaged multiple times to breathe deeply; ; likely low O2 saturation ; states " i think i was still just too drugged up   . Depression   . GERD (gastroesophageal reflux disease)   . History of kidney stones   . Sleep apnea    no CPAP use yet ; hasnt gotten it    Past Surgical History: Past Surgical History:  Procedure Laterality Date  . CHOLECYSTECTOMY  09/10/1995  . LAPAROSCOPIC GASTRIC SLEEVE RESECTION N/A 02/08/2017   Procedure: LAPAROSCOPIC GASTRIC SLEEVE RESECTION, UPPER ENDO;  Surgeon: Luretha MurphyMartin, Matthew, MD;  Location: [facility];  Service: General;  Laterality: N/A;  . TONSILLECTOMY    . TOTAL VAGINAL HYSTERECTOMY  06/10/2011  . TUBAL LIGATION  03/10/1999   Social History: Social History   Socioeconomic History  . Marital status: Married    Spouse name: Not on file  . Number of children: Not on file  . Years of education: Not on file  . Highest education level: Not on file  Occupational History  . Not on file  Social Needs  . Financial resource strain: Not on file  . Food insecurity:    Worry: Not on file    Inability: Not on file  . Transportation needs:    Medical: Not on file    Non-medical: Not on file  Tobacco Use  . Smoking status: Former Smoker    Packs/day: 1.00    Years: 26.00    Pack years: 26.00    Types: Cigarettes    Start date: 07/19/1990    Last attempt  to quit: 11/25/2016    Years since quitting: 1.1  . Smokeless tobacco: Never Used  Substance and Sexual Activity  . Alcohol use: Yes    Alcohol/week: 1.8 oz    Types: 3 Standard drinks or equivalent per week  . Drug use: No  . Sexual activity: Yes    Partners: Female    Birth control/protection: Other-see comments  Lifestyle  . Physical activity:    Days per week: Not on file    Minutes per session: Not on file  . Stress: Not on file  Relationships  . Social connections:    Talks on phone: Not on file    Gets together: Not on file    Attends religious service: Not on file    Active member of club or organization: Not on file    Attends meetings of clubs or organizations: Not on file    Relationship status: Not on file  Other Topics Concern  . Not on file  Social History Narrative  . Not on file   Family History: Family History  Problem Relation Age of Onset  . Hyperlipidemia Mother   . Hypertension Mother   . Hyperlipidemia Father   . Hypertension Father   . Cancer Sister        Colon  . Cancer  Maternal Grandmother   . Cancer Paternal Grandmother    Allergies: Allergies  Allergen Reactions  . Codeine Nausea And Vomiting   Medications: See med rec.  Review of Systems: No fevers, chills, night sweats, weight loss, chest pain, or shortness of breath.   Objective:    General: Well Developed, well nourished, and in no acute distress.  Neuro: Alert and oriented x3, extra-ocular muscles intact, sensation grossly intact.  HEENT: Normocephalic, atraumatic, pupils equal round reactive to light, neck supple, no masses, no lymphadenopathy, thyroid nonpalpable.  Skin: Warm and dry, no rashes. Cardiac: Regular rate and rhythm, no murmurs rubs or gallops, no lower extremity edema.  Respiratory: Clear to auscultation bilaterally. Not using accessory muscles, speaking in full sentences.  Impression and Recommendations:    Anxiety and depression Continue Celexa 10, we  started trazodone 50 at bedtime, she is done well but notices some waning efficacy, increasing to 100 mg at night.  GERD (gastroesophageal reflux disease) Omeprazole twice a day has been effective. Next step would be adding ranitidine 300 twice a day but symptoms are well controlled.  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 ___________________________________________ Ihor Austin. Benjamin Stain, M.D., ABFM., CAQSM. Primary Care and Sports Medicine [facility]  Adjunct Instructor of Family Medicine  [employer]

## 2018-02-07 ENCOUNTER — Other Ambulatory Visit: Payer: Self-pay | Admitting: Sports Medicine

## 2018-02-07 DIAGNOSIS — F419 Anxiety disorder, unspecified: Principal | ICD-10-CM

## 2018-02-07 DIAGNOSIS — F329 Major depressive disorder, single episode, unspecified: Secondary | ICD-10-CM

## 2018-02-07 DIAGNOSIS — F32A Depression, unspecified: Secondary | ICD-10-CM

## 2018-02-13 ENCOUNTER — Ambulatory Visit (INDEPENDENT_AMBULATORY_CARE_PROVIDER_SITE_OTHER): Payer: BLUE CROSS/BLUE SHIELD | Admitting: Sports Medicine

## 2018-02-13 ENCOUNTER — Encounter: Payer: Self-pay | Admitting: Sports Medicine

## 2018-02-13 DIAGNOSIS — Z9884 Bariatric surgery status: Secondary | ICD-10-CM

## 2018-02-13 DIAGNOSIS — F32A Depression, unspecified: Secondary | ICD-10-CM

## 2018-02-13 DIAGNOSIS — F419 Anxiety disorder, unspecified: Secondary | ICD-10-CM

## 2018-02-13 DIAGNOSIS — F329 Major depressive disorder, single episode, unspecified: Secondary | ICD-10-CM

## 2018-02-13 MED ORDER — TRAZODONE HCL 50 MG PO TABS
50.0000 mg | ORAL_TABLET | Freq: Every day | ORAL | 3 refills | Status: DC
Start: 2018-02-13 — End: 2018-05-11

## 2018-02-13 MED ORDER — PHENTERMINE HCL 37.5 MG PO TABS: ORAL_TABLET | ORAL | 0 refills | Status: DC

## 2018-02-13 MED ORDER — CITALOPRAM HYDROBROMIDE 20 MG PO TABS
30.0000 mg | ORAL_TABLET | Freq: Every day | ORAL | 3 refills | Status: DC
Start: 2018-02-13 — End: 2018-03-10

## 2018-02-13 NOTE — Assessment & Plan Note (Signed)
Good continued weight loss, refilling phentermine, entering the fourth month.

## 2018-02-13 NOTE — Progress Notes (Signed)
Subjective:    CC: Weight check  HPI: Obesity: Post sleeve gastrectomy, doing well on phentermine, entering the fourth month, lost good weight after the third month.  Depression and anxiety: On Celexa 20, having some fog, forgetfulness, tiredness.  She was on trazodone, we increased to 100 mg at the last visit, she has self discontinued and dropped back down to 50 mg.  No suicidal or homicidal ideation, agreeable to go up on Celexa.  I reviewed the past medical history, family history, social history, surgical history, and allergies today and no changes were needed.  Please see the problem list section below in epic for further details.  Past Medical History: Past Medical History:  Diagnosis Date  . Anxiety   . Arthritis   . Complication of anesthesia    per patient had to be encourgaged multiple times to breathe deeply; ; likely low O2 saturation ; states " i think i was still just too drugged up   . Depression   . GERD (gastroesophageal reflux disease)   . History of kidney stones   . Sleep apnea    no CPAP use yet ; hasnt gotten it    Past Surgical History: Past Surgical History:  Procedure Laterality Date  . CHOLECYSTECTOMY  09/10/1995  . LAPAROSCOPIC GASTRIC SLEEVE RESECTION N/A 02/08/2017   Procedure: LAPAROSCOPIC GASTRIC SLEEVE RESECTION, UPPER ENDO;  Surgeon: Luretha MurphyMartin, Matthew, MD;  Location: [facility];  Service: General;  Laterality: N/A;  . TONSILLECTOMY    . TOTAL VAGINAL HYSTERECTOMY  06/10/2011  . TUBAL LIGATION  03/10/1999   Social History: Social History   Socioeconomic History  . Marital status: Married    Spouse name: Not on file  . Number of children: Not on file  . Years of education: Not on file  . Highest education level: Not on file  Occupational History  . Not on file  Social Needs  . Financial resource strain: Not on file  . Food insecurity:    Worry: Not on file    Inability: Not on file  . Transportation needs:    Medical: Not on file   Non-medical: Not on file  Tobacco Use  . Smoking status: Former Smoker    Packs/day: 1.00    Years: 26.00    Pack years: 26.00    Types: Cigarettes    Start date: 07/19/1990    Last attempt to quit: 11/25/2016    Years since quitting: 1.2  . Smokeless tobacco: Never Used  Substance and Sexual Activity  . Alcohol use: Yes    Alcohol/week: 1.8 oz    Types: 3 Standard drinks or equivalent per week  . Drug use: No  . Sexual activity: Yes    Partners: Female    Birth control/protection: Other-see comments  Lifestyle  . Physical activity:    Days per week: Not on file    Minutes per session: Not on file  . Stress: Not on file  Relationships  . Social connections:    Talks on phone: Not on file    Gets together: Not on file    Attends religious service: Not on file    Active member of club or organization: Not on file    Attends meetings of clubs or organizations: Not on file    Relationship status: Not on file  Other Topics Concern  . Not on file  Social History Narrative  . Not on file   Family History: Family History  Problem Relation Age of Onset  .  Hyperlipidemia Mother   . Hypertension Mother   . Hyperlipidemia Father   . Hypertension Father   . Cancer Sister        Colon  . Cancer Maternal Grandmother   . Cancer Paternal Grandmother    Allergies: Allergies  Allergen Reactions  . Codeine Nausea And Vomiting   Medications: See med rec.  Review of Systems: No fevers, chills, night sweats, weight loss, chest pain, or shortness of breath.   Objective:    General: Well Developed, well nourished, and in no acute distress.  Neuro: Alert and oriented x3, extra-ocular muscles intact, sensation grossly intact.  HEENT: Normocephalic, atraumatic, pupils equal round reactive to light, neck supple, no masses, no lymphadenopathy, thyroid nonpalpable.  Skin: Warm and dry, no rashes. Cardiac: Regular rate and rhythm, no murmurs rubs or gallops, no lower extremity edema.    Respiratory: Clear to auscultation bilaterally. Not using accessory muscles, speaking in full sentences.  Impression and Recommendations:    Anxiety and depression Minimal fog, forgetfulness, she dropped her trazodone back down to 50 mg, feels a little bit better, increasing Celexa to 30 mg, she was previously taking 20 mg. I did explain to her that increases in forgetfulness, drowsiness, fog are more likely related to anxiety and depression then some sort of rapid onset dementia.  We can keep an eye on this for now.  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  I spent 25 minutes with this patient, greater than 50% was face-to-face time counseling regarding the above diagnoses ___________________________________________ Ihor Austin. Benjamin Stain, M.D., ABFM., CAQSM. Primary Care and Sports Medicine [facility]  Adjunct Instructor of Family Medicine  [employer]

## 2018-02-13 NOTE — Assessment & Plan Note (Signed)
Minimal fog, forgetfulness, she dropped her trazodone back down to 50 mg, feels a little bit better, increasing Celexa to 30 mg, she was previously taking 20 mg. I did explain to her that increases in forgetfulness, drowsiness, fog are more likely related to anxiety and depression then some sort of rapid onset dementia.  We can keep an eye on this for now.

## 2018-03-10 ENCOUNTER — Other Ambulatory Visit: Payer: Self-pay | Admitting: Sports Medicine

## 2018-03-10 DIAGNOSIS — F32A Depression, unspecified: Secondary | ICD-10-CM

## 2018-03-10 DIAGNOSIS — F329 Major depressive disorder, single episode, unspecified: Secondary | ICD-10-CM

## 2018-03-10 DIAGNOSIS — F419 Anxiety disorder, unspecified: Principal | ICD-10-CM

## 2018-03-14 ENCOUNTER — Other Ambulatory Visit: Payer: Self-pay | Admitting: Sports Medicine

## 2018-03-14 ENCOUNTER — Ambulatory Visit: Payer: BLUE CROSS/BLUE SHIELD | Admitting: Sports Medicine

## 2018-03-14 DIAGNOSIS — Z9884 Bariatric surgery status: Secondary | ICD-10-CM

## 2018-03-31 ENCOUNTER — Telehealth: Payer: Self-pay | Admitting: *Deleted

## 2018-03-31 ENCOUNTER — Ambulatory Visit: Payer: BLUE CROSS/BLUE SHIELD | Admitting: Sports Medicine

## 2018-03-31 MED ORDER — TRAMADOL HCL 50 MG PO TABS: 50.0000 mg | ORAL_TABLET | Freq: Three times a day (TID) | ORAL | 0 refills | Status: DC | PRN

## 2018-03-31 NOTE — Telephone Encounter (Signed)
Called and notified pt of medication. No further questions or concerns at this time.

## 2018-03-31 NOTE — Telephone Encounter (Signed)
Pt left vm stating that Meloxicam isn't helping her back/hip pain and is wanting to know if you could send her something in.  She stated that she is having trouble walking due to the pain.  Please advise.

## 2018-03-31 NOTE — Telephone Encounter (Signed)
Short course of tramadol called in, I will probably need to see her before anything else is sent in.

## 2018-04-04 ENCOUNTER — Encounter: Payer: Self-pay | Admitting: Sports Medicine

## 2018-04-04 ENCOUNTER — Ambulatory Visit (INDEPENDENT_AMBULATORY_CARE_PROVIDER_SITE_OTHER): Payer: BLUE CROSS/BLUE SHIELD | Admitting: Sports Medicine

## 2018-04-04 VITALS — BP 101/66 | HR 85 | Ht 61.75 in | Wt 134.0 lb

## 2018-04-04 DIAGNOSIS — R6889 Other general symptoms and signs: Secondary | ICD-10-CM | POA: Diagnosis not present

## 2018-04-04 DIAGNOSIS — Z23 Encounter for immunization: Secondary | ICD-10-CM | POA: Diagnosis not present

## 2018-04-04 DIAGNOSIS — F419 Anxiety disorder, unspecified: Secondary | ICD-10-CM | POA: Diagnosis not present

## 2018-04-04 DIAGNOSIS — Z9884 Bariatric surgery status: Secondary | ICD-10-CM

## 2018-04-04 DIAGNOSIS — R29818 Other symptoms and signs involving the nervous system: Secondary | ICD-10-CM

## 2018-04-04 DIAGNOSIS — F329 Major depressive disorder, single episode, unspecified: Secondary | ICD-10-CM

## 2018-04-04 DIAGNOSIS — F32A Depression, unspecified: Secondary | ICD-10-CM

## 2018-04-04 MED ORDER — CITALOPRAM HYDROBROMIDE 10 MG PO TABS: ORAL_TABLET | ORAL | 0 refills | Status: DC

## 2018-04-04 MED ORDER — DULOXETINE HCL 30 MG PO CPEP: 30.0000 mg | ORAL_CAPSULE | Freq: Every day | ORAL | 3 refills | Status: DC

## 2018-04-04 MED ORDER — PHENTERMINE HCL 37.5 MG PO TABS: ORAL_TABLET | ORAL | 0 refills | Status: DC

## 2018-04-04 NOTE — Assessment & Plan Note (Signed)
Progressive and worsening forgetfulness, absentmindedness, word finding difficulties. 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 We are going to get a second opinion from neurology per patient request, add a brain MRI, I am also checking B12 levels, TSH, and other labs as she is post sleeve gastrectomy.

## 2018-04-04 NOTE — Assessment & Plan Note (Signed)
Persistent mental fog. Forgetfulness. Did a little bit better with decreasing trazodone to 50 mg, we did increase Celexa to 30 mg at the last visit without much improvement. 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 Down taper on Celexa and switching to Cymbalta 30.

## 2018-04-04 NOTE — Progress Notes (Addendum)
Subjective:    CC: Several issues  HPI: Forgetfulness: Worsening absentmindedness, forgetfulness.  Word finding difficulties.  No focal weakness, numbness.  No visual changes.  No headaches.  Anxiety and depression: Persistent mental fog, widespread aches and pains.  No suicidal or homicidal ideation.  Abnormal weight gain: Has lost over 100 pounds with sleeve gastrectomy.  We are helping her lose a little bit more weight with phentermine.  Is down another 3 to 4 pounds over the past month, entering the fifth month.  I reviewed the past medical history, family history, social history, surgical history, and allergies today and no changes were needed.  Please see the problem list section below in epic for further details.  Past Medical History: Past Medical History:  Diagnosis Date  . Anxiety   . Arthritis   . Complication of anesthesia    per patient had to be encourgaged multiple times to breathe deeply; ; likely low O2 saturation ; states " i think i was still just too drugged up   . Depression   . GERD (gastroesophageal reflux disease)   . History of kidney stones   . Sleep apnea    no CPAP use yet ; hasnt gotten it    Past Surgical History: Past Surgical History:  Procedure Laterality Date  . CHOLECYSTECTOMY  09/10/1995  . LAPAROSCOPIC GASTRIC SLEEVE RESECTION N/A 02/08/2017   Procedure: LAPAROSCOPIC GASTRIC SLEEVE RESECTION, UPPER ENDO;  Surgeon: Luretha MurphyMartin, Matthew, MD;  Location: [facility];  Service: General;  Laterality: N/A;  . TONSILLECTOMY    . TOTAL VAGINAL HYSTERECTOMY  06/10/2011  . TUBAL LIGATION  03/10/1999   Social History: Social History   Socioeconomic History  . Marital status: Married    Spouse name: Not on file  . Number of children: Not on file  . Years of education: Not on file  . Highest education level: Not on file  Occupational History  . Not on file  Social Needs  . Financial resource strain: Not on file  . Food insecurity:    Worry: Not on file      Inability: Not on file  . Transportation needs:    Medical: Not on file    Non-medical: Not on file  Tobacco Use  . Smoking status: Former Smoker    Packs/day: 1.00    Years: 26.00    Pack years: 26.00    Types: Cigarettes    Start date: 07/19/1990    Last attempt to quit: 11/25/2016    Years since quitting: 1.3  . Smokeless tobacco: Never Used  Substance and Sexual Activity  . Alcohol use: Yes    Alcohol/week: 3.0 standard drinks    Types: 3 Standard drinks or equivalent per week  . Drug use: No  . Sexual activity: Yes    Partners: Female    Birth control/protection: Other-see comments  Lifestyle  . Physical activity:    Days per week: Not on file    Minutes per session: Not on file  . Stress: Not on file  Relationships  . Social connections:    Talks on phone: Not on file    Gets together: Not on file    Attends religious service: Not on file    Active member of club or organization: Not on file    Attends meetings of clubs or organizations: Not on file    Relationship status: Not on file  Other Topics Concern  . Not on file  Social History Narrative  . Not on file  Family History: Family History  Problem Relation Age of Onset  . Hyperlipidemia Mother   . Hypertension Mother   . Hyperlipidemia Father   . Hypertension Father   . Cancer Sister        Colon  . Cancer Maternal Grandmother   . Cancer Paternal Grandmother    Allergies: Allergies  Allergen Reactions  . Codeine Nausea And Vomiting   Medications: See med rec.  Review of Systems: No fevers, chills, night sweats, weight loss, chest pain, or shortness of breath.   Objective:    General: Well Developed, well nourished, and in no acute distress.  Neuro: Alert and oriented x3, extra-ocular muscles intact, sensation grossly intact.  Cranial nerves II through XII are intact, motor, sensory, coordinative functions are all intact, negative Romberg sign, no pronator drift.  No finger dysmetria, no  dysdiadochokinesis.  Normal gait. HEENT: Normocephalic, atraumatic, pupils equal round reactive to light, neck supple, no masses, no lymphadenopathy, thyroid nonpalpable.  Skin: Warm and dry, no rashes. Cardiac: Regular rate and rhythm, no murmurs rubs or gallops, no lower extremity edema.  Respiratory: Clear to auscultation bilaterally. Not using accessory muscles, speaking in full sentences.  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  Anxiety and depression Persistent mental fog. Forgetfulness. Did a little bit better with decreasing trazodone to 50 mg, we did increase Celexa to 30 mg at the last visit without much improvement. 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 Down taper on Celexa and switching to Cymbalta 30.   Forgetfulness Progressive and worsening forgetfulness, absentmindedness, word finding difficulties. 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 We are going to get a second opinion from neurology per patient request, add a brain MRI, I am also checking B12 levels, TSH, and other labs as she is post sleeve gastrectomy. ___________________________________________ Ihor Austin. Benjamin Stain, M.D., ABFM., CAQSM. Primary Care and Sports Medicine [facility]  Adjunct Instructor of Family Medicine  [employer]

## 2018-04-04 NOTE — Assessment & Plan Note (Signed)
During the fifth month, good continued weight loss, she is post sleeve gastrectomy and well over 100 pounds down.

## 2018-04-05 LAB — CBC
HCT: 39.8 % (ref 35.0–45.0)
Hemoglobin: 13.4 g/dL (ref 11.7–15.5)
MCH: 29.7 pg (ref 27.0–33.0)
MCHC: 33.7 g/dL (ref 32.0–36.0)
MCV: 88.2 fL (ref 80.0–100.0)
MPV: 10 fL (ref 7.5–12.5)
Platelets: 320 10*3/uL (ref 140–400)
RBC: 4.51 Million/uL (ref 3.80–5.10)
RDW: 12.4 % (ref 11.0–15.0)
WBC: 7.6 10*3/uL (ref 3.8–10.8)

## 2018-04-05 LAB — FOLATE: Folate: 10 ng/mL

## 2018-04-05 LAB — VITAMIN B12: Vitamin B-12: 636 pg/mL (ref 200–1100)

## 2018-04-05 LAB — RPR: RPR Ser Ql: NONREACTIVE

## 2018-04-05 LAB — SEDIMENTATION RATE: Sed Rate: 2 mm/h (ref 0–20)

## 2018-04-05 LAB — TSH: TSH: 1.65 mIU/L

## 2018-04-25 ENCOUNTER — Other Ambulatory Visit: Payer: Self-pay | Admitting: Sports Medicine

## 2018-04-25 DIAGNOSIS — F329 Major depressive disorder, single episode, unspecified: Secondary | ICD-10-CM

## 2018-04-25 DIAGNOSIS — F419 Anxiety disorder, unspecified: Principal | ICD-10-CM

## 2018-04-27 ENCOUNTER — Encounter: Payer: Self-pay | Admitting: Sports Medicine

## 2018-04-27 ENCOUNTER — Ambulatory Visit (INDEPENDENT_AMBULATORY_CARE_PROVIDER_SITE_OTHER): Payer: BLUE CROSS/BLUE SHIELD | Admitting: Sports Medicine

## 2018-04-27 DIAGNOSIS — F419 Anxiety disorder, unspecified: Secondary | ICD-10-CM | POA: Diagnosis not present

## 2018-04-27 DIAGNOSIS — R0789 Other chest pain: Secondary | ICD-10-CM | POA: Diagnosis not present

## 2018-04-27 DIAGNOSIS — F329 Major depressive disorder, single episode, unspecified: Secondary | ICD-10-CM | POA: Diagnosis not present

## 2018-04-27 DIAGNOSIS — F32A Depression, unspecified: Secondary | ICD-10-CM

## 2018-04-27 MED ORDER — ALPRAZOLAM 0.5 MG PO TABS: 0.5000 mg | ORAL_TABLET | Freq: Two times a day (BID) | ORAL | 0 refills | Status: DC | PRN

## 2018-04-27 MED ORDER — NITROGLYCERIN 0.4 MG SL SUBL: 0.4000 mg | SUBLINGUAL_TABLET | SUBLINGUAL | 0 refills | Status: DC | PRN

## 2018-04-27 NOTE — Assessment & Plan Note (Signed)
A couple of episodes of atypical chest pain, they have all occurred at rest, and they do occur with significant anxiety and panic. She is able to climb stairs and do Zumba classes without any chest pain. Went to the emergency department, troponins were negative, no acute findings on ECG, normal chest x-ray. She did tell them that nitroglycerin relieved her chest pain. I am going to set her up with a stress test. ECG today. Nitroglycerin to use as needed.

## 2018-04-27 NOTE — Assessment & Plan Note (Signed)
We recently switched her to Cymbalta 30 less than a month ago. I do think that her episode of chest pain was more likely related to anxiety and panic, adding alprazolam 0.5 mg to use as needed.

## 2018-04-27 NOTE — Progress Notes (Signed)
Subjective:    CC: Chest pain  HPI: Rita Bentley is a pleasant 47 year old female with a history of generalized anxiety, we recently switched her from Zoloft to Cymbalta due to some fibro-fog.  Several days ago she went to the emergency department with an episode of sharp stabbing chest pain, substernal.  This is the third time she is experienced this.  Typically she is at rest or sleep, will confirm sleep with the crushing chest pain, no shortness of breath, nothing is pleuritic, and pain is not reproducible with palpation.  She never gets chest pain with physical exertion, she can do Zumba classes without any discomfort.  She does get significant panic during these episodes.  She was seen in the ER, given nitroglycerin which resolved her chest pain.  Chest x-ray, routine labs, troponins were all negative.  I reviewed the past medical history, family history, social history, surgical history, and allergies today and no changes were needed.  Please see the problem list section below in epic for further details.  Past Medical History: Past Medical History:  Diagnosis Date  . Anxiety   . Arthritis   . Complication of anesthesia    per patient had to be encourgaged multiple times to breathe deeply; ; likely low O2 saturation ; states " i think i was still just too drugged up   . Depression   . GERD (gastroesophageal reflux disease)   . History of kidney stones   . Sleep apnea    no CPAP use yet ; hasnt gotten it    Past Surgical History: Past Surgical History:  Procedure Laterality Date  . CHOLECYSTECTOMY  09/10/1995  . LAPAROSCOPIC GASTRIC SLEEVE RESECTION N/A 02/08/2017   Procedure: LAPAROSCOPIC GASTRIC SLEEVE RESECTION, UPPER ENDO;  Surgeon: Luretha Murphy, MD;  Location: [facility];  Service: General;  Laterality: N/A;  . TONSILLECTOMY    . TOTAL VAGINAL HYSTERECTOMY  06/10/2011  . TUBAL LIGATION  03/10/1999   Social History: Social History   Socioeconomic History  . Marital status:  Married    Spouse name: Not on file  . Number of children: Not on file  . Years of education: Not on file  . Highest education level: Not on file  Occupational History  . Not on file  Social Needs  . Financial resource strain: Not on file  . Food insecurity:    Worry: Not on file    Inability: Not on file  . Transportation needs:    Medical: Not on file    Non-medical: Not on file  Tobacco Use  . Smoking status: Former Smoker    Packs/day: 1.00    Years: 26.00    Pack years: 26.00    Types: Cigarettes    Start date: 07/19/1990    Last attempt to quit: 11/25/2016    Years since quitting: 1.4  . Smokeless tobacco: Never Used  Substance and Sexual Activity  . Alcohol use: Yes    Alcohol/week: 3.0 standard drinks    Types: 3 Standard drinks or equivalent per week  . Drug use: No  . Sexual activity: Yes    Partners: Female    Birth control/protection: Other-see comments  Lifestyle  . Physical activity:    Days per week: Not on file    Minutes per session: Not on file  . Stress: Not on file  Relationships  . Social connections:    Talks on phone: Not on file    Gets together: Not on file    Attends religious service:  Not on file    Active member of club or organization: Not on file    Attends meetings of clubs or organizations: Not on file    Relationship status: Not on file  Other Topics Concern  . Not on file  Social History Narrative  . Not on file   Family History: Family History  Problem Relation Age of Onset  . Hyperlipidemia Mother   . Hypertension Mother   . Hyperlipidemia Father   . Hypertension Father   . Cancer Sister        Colon  . Cancer Maternal Grandmother   . Cancer Paternal Grandmother    Allergies: Allergies  Allergen Reactions  . Codeine Nausea And Vomiting   Medications: See med rec.  Review of Systems: No fevers, chills, night sweats, weight loss, chest pain, or shortness of breath.   Objective:    General: Well Developed, well  nourished, and in no acute distress.  Neuro: Alert and oriented x3, extra-ocular muscles intact, sensation grossly intact.  HEENT: Normocephalic, atraumatic, pupils equal round reactive to light, neck supple, no masses, no lymphadenopathy, thyroid nonpalpable.  Skin: Warm and dry, no rashes. Cardiac: Regular rate and rhythm, no murmurs rubs or gallops, no lower extremity edema.  Respiratory: Clear to auscultation bilaterally. Not using accessory muscles, speaking in full sentences.  Twelve-lead resting ECG personally reviewed, normal sinus rhythm, normal axis, normal rate.  No ST changes,  Impression and Recommendations:    Atypical chest pain A couple of episodes of atypical chest pain, they have all occurred at rest, and they do occur with significant anxiety and panic. She is able to climb stairs and do Zumba classes without any chest pain. Went to the emergency department, troponins were negative, no acute findings on ECG, normal chest x-ray. She did tell them that nitroglycerin relieved her chest pain. I am going to set her up with a stress test. ECG today. Nitroglycerin to use as needed.  Anxiety and depression We recently switched her to Cymbalta 30 less than a month ago. I do think that her episode of chest pain was more likely related to anxiety and panic, adding alprazolam 0.5 mg to use as needed.  ___________________________________________ Ihor Austinhomas J. Benjamin Stainhekkekandam, M.D., ABFM., CAQSM. Primary Care and Sports Medicine [facility]  Adjunct Instructor of Family Medicine  [employer]

## 2018-05-01 ENCOUNTER — Ambulatory Visit: Payer: BLUE CROSS/BLUE SHIELD | Admitting: Sports Medicine

## 2018-05-11 ENCOUNTER — Encounter: Payer: Self-pay | Admitting: Sports Medicine

## 2018-05-11 ENCOUNTER — Ambulatory Visit (INDEPENDENT_AMBULATORY_CARE_PROVIDER_SITE_OTHER): Payer: BLUE CROSS/BLUE SHIELD

## 2018-05-11 DIAGNOSIS — R0789 Other chest pain: Secondary | ICD-10-CM | POA: Diagnosis not present

## 2018-05-11 DIAGNOSIS — F329 Major depressive disorder, single episode, unspecified: Secondary | ICD-10-CM

## 2018-05-11 DIAGNOSIS — F419 Anxiety disorder, unspecified: Principal | ICD-10-CM

## 2018-05-11 DIAGNOSIS — F32A Depression, unspecified: Secondary | ICD-10-CM

## 2018-05-11 LAB — EXERCISE TOLERANCE TEST
Estimated workload: 11.7 METS
Exercise duration (min): 10 min
Exercise duration (sec): 0 s
MPHR: 175 {beats}/min
Peak BP: 161 mmHg
Peak HR: 173 {beats}/min
Percent HR: 98 %
RPE: 17
Rest HR: 74 {beats}/min

## 2018-05-11 MED ORDER — TRAZODONE HCL 50 MG PO TABS: 50.0000 mg | ORAL_TABLET | Freq: Every day | ORAL | 3 refills | Status: DC

## 2018-05-24 ENCOUNTER — Other Ambulatory Visit: Payer: Self-pay | Admitting: Sports Medicine

## 2018-05-24 DIAGNOSIS — F329 Major depressive disorder, single episode, unspecified: Secondary | ICD-10-CM

## 2018-05-24 DIAGNOSIS — F419 Anxiety disorder, unspecified: Principal | ICD-10-CM

## 2018-05-24 DIAGNOSIS — F32A Depression, unspecified: Secondary | ICD-10-CM

## 2018-05-25 ENCOUNTER — Encounter: Payer: Self-pay | Admitting: Sports Medicine

## 2018-05-25 ENCOUNTER — Ambulatory Visit (INDEPENDENT_AMBULATORY_CARE_PROVIDER_SITE_OTHER): Payer: BLUE CROSS/BLUE SHIELD | Admitting: Sports Medicine

## 2018-05-25 DIAGNOSIS — F329 Major depressive disorder, single episode, unspecified: Secondary | ICD-10-CM | POA: Diagnosis not present

## 2018-05-25 DIAGNOSIS — F32A Depression, unspecified: Secondary | ICD-10-CM

## 2018-05-25 DIAGNOSIS — R6889 Other general symptoms and signs: Secondary | ICD-10-CM

## 2018-05-25 DIAGNOSIS — R0789 Other chest pain: Secondary | ICD-10-CM | POA: Diagnosis not present

## 2018-05-25 DIAGNOSIS — F419 Anxiety disorder, unspecified: Secondary | ICD-10-CM

## 2018-05-25 MED ORDER — ALPRAZOLAM 0.5 MG PO TABS: 0.5000 mg | ORAL_TABLET | Freq: Two times a day (BID) | ORAL | 0 refills | Status: DC | PRN

## 2018-05-25 MED ORDER — DULOXETINE HCL 60 MG PO CPEP: 60.0000 mg | ORAL_CAPSULE | Freq: Every day | ORAL | 3 refills | Status: DC

## 2018-05-25 NOTE — Assessment & Plan Note (Signed)
Chest pain was atypical and likely due to panic and anxiety. She is historically able to climb stairs and do Zumba classes without any chest pain. We set her up with a treadmill stress test which came back wonderfully negative. No further intervention or evaluation needed.

## 2018-05-25 NOTE — Progress Notes (Signed)
Subjective:    CC: Follow-up  HPI: Atypical chest pain: Negative stress test.  Anxiety and depression: With absentmindedness, fibro-fog, starting to lift with Cymbalta 30.  Her periscapular burning pain is also improving.  I reviewed the past medical history, family history, social history, surgical history, and allergies today and no changes were needed.  Please see the problem list section below in epic for further details.  Past Medical History: Past Medical History:  Diagnosis Date  . Anxiety   . Arthritis   . Complication of anesthesia    per patient had to be encourgaged multiple times to breathe deeply; ; likely low O2 saturation ; states " i think i was still just too drugged up   . Depression   . GERD (gastroesophageal reflux disease)   . History of kidney stones   . Sleep apnea    no CPAP use yet ; hasnt gotten it    Past Surgical History: Past Surgical History:  Procedure Laterality Date  . CHOLECYSTECTOMY  09/10/1995  . LAPAROSCOPIC GASTRIC SLEEVE RESECTION N/A 02/08/2017   Procedure: LAPAROSCOPIC GASTRIC SLEEVE RESECTION, UPPER ENDO;  Surgeon: Luretha Murphy, MD;  Location: [facility];  Service: General;  Laterality: N/A;  . TONSILLECTOMY    . TOTAL VAGINAL HYSTERECTOMY  06/10/2011  . TUBAL LIGATION  03/10/1999   Social History: Social History   Socioeconomic History  . Marital status: Married    Spouse name: Not on file  . Number of children: Not on file  . Years of education: Not on file  . Highest education level: Not on file  Occupational History  . Not on file  Social Needs  . Financial resource strain: Not on file  . Food insecurity:    Worry: Not on file    Inability: Not on file  . Transportation needs:    Medical: Not on file    Non-medical: Not on file  Tobacco Use  . Smoking status: Former Smoker    Packs/day: 1.00    Years: 26.00    Pack years: 26.00    Types: Cigarettes    Start date: 07/19/1990    Last attempt to quit: 11/25/2016   Years since quitting: 1.4  . Smokeless tobacco: Never Used  Substance and Sexual Activity  . Alcohol use: Yes    Alcohol/week: 3.0 standard drinks    Types: 3 Standard drinks or equivalent per week  . Drug use: No  . Sexual activity: Yes    Partners: Female    Birth control/protection: Other-see comments  Lifestyle  . Physical activity:    Days per week: Not on file    Minutes per session: Not on file  . Stress: Not on file  Relationships  . Social connections:    Talks on phone: Not on file    Gets together: Not on file    Attends religious service: Not on file    Active member of club or organization: Not on file    Attends meetings of clubs or organizations: Not on file    Relationship status: Not on file  Other Topics Concern  . Not on file  Social History Narrative  . Not on file   Family History: Family History  Problem Relation Age of Onset  . Hyperlipidemia Mother   . Hypertension Mother   . Hyperlipidemia Father   . Hypertension Father   . Cancer Sister        Colon  . Cancer Maternal Grandmother   . Cancer Paternal Grandmother  Allergies: Allergies  Allergen Reactions  . Codeine Nausea And Vomiting   Medications: See med rec.  Review of Systems: No fevers, chills, night sweats, weight loss, chest pain, or shortness of breath.   Objective:    General: Well Developed, well nourished, and in no acute distress.  Neuro: Alert and oriented x3, extra-ocular muscles intact, sensation grossly intact.  HEENT: Normocephalic, atraumatic, pupils equal round reactive to light, neck supple, no masses, no lymphadenopathy, thyroid nonpalpable.  Skin: Warm and dry, no rashes. Cardiac: Regular rate and rhythm, no murmurs rubs or gallops, no lower extremity edema.  Respiratory: Clear to auscultation bilaterally. Not using accessory muscles, speaking in full sentences.  Impression and Recommendations:    Forgetfulness Brain MRI and other labs are normal. 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 They are improving with Cymbalta.  Anxiety and depression Continued improvements in symptoms and pain with Cymbalta 30. Less panic. Periscapular burning pain is also less with the addition of Cymbalta. I am going to refill her alprazolam. Increasing Cymbalta to 60 mg. Return to see me in a month.  Atypical chest pain Chest pain was atypical and likely due to panic and anxiety. She is historically able to climb stairs and do Zumba classes without any chest pain. We set her up with a treadmill stress test which came back wonderfully negative. No further intervention or evaluation needed.  I spent 25 minutes with this patient, greater than 50% was face-to-face time counseling regarding the above diagnoses specifically regarding the prognosis and treatment plan for the above diagnoses. ___________________________________________ Ihor Austin. Benjamin Stain, M.D., ABFM., CAQSM. Primary Care and Sports Medicine [facility]  Adjunct Professor of Family Medicine  [employer]

## 2018-05-25 NOTE — Assessment & Plan Note (Signed)
Continued improvements in symptoms and pain with Cymbalta 30. Less panic. Periscapular burning pain is also less with the addition of Cymbalta. I am going to refill her alprazolam. Increasing Cymbalta to 60 mg. Return to see me in a month.

## 2018-05-25 NOTE — Assessment & Plan Note (Signed)
Brain MRI and other labs are normal. 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 They are improving with Cymbalta.

## 2018-05-29 ENCOUNTER — Ambulatory Visit: Payer: Self-pay | Admitting: Neurology

## 2018-06-11 ENCOUNTER — Other Ambulatory Visit: Payer: Self-pay | Admitting: Sports Medicine

## 2018-06-11 DIAGNOSIS — Z9884 Bariatric surgery status: Secondary | ICD-10-CM

## 2018-06-12 ENCOUNTER — Other Ambulatory Visit: Payer: Self-pay | Admitting: Sports Medicine

## 2018-06-12 MED ORDER — TRAMADOL HCL 50 MG PO TABS: 50.0000 mg | ORAL_TABLET | Freq: Three times a day (TID) | ORAL | 0 refills | Status: DC | PRN

## 2018-06-14 ENCOUNTER — Other Ambulatory Visit: Payer: Self-pay | Admitting: Sports Medicine

## 2018-06-14 DIAGNOSIS — F329 Major depressive disorder, single episode, unspecified: Secondary | ICD-10-CM

## 2018-06-14 DIAGNOSIS — F419 Anxiety disorder, unspecified: Principal | ICD-10-CM

## 2018-06-19 ENCOUNTER — Other Ambulatory Visit: Payer: Self-pay | Admitting: Sports Medicine

## 2018-06-19 DIAGNOSIS — F329 Major depressive disorder, single episode, unspecified: Secondary | ICD-10-CM

## 2018-06-19 DIAGNOSIS — F419 Anxiety disorder, unspecified: Principal | ICD-10-CM

## 2018-06-27 ENCOUNTER — Ambulatory Visit: Payer: BLUE CROSS/BLUE SHIELD | Admitting: Sports Medicine

## 2018-08-15 ENCOUNTER — Encounter: Payer: Self-pay | Admitting: Sports Medicine

## 2018-08-15 ENCOUNTER — Ambulatory Visit (INDEPENDENT_AMBULATORY_CARE_PROVIDER_SITE_OTHER): Payer: BLUE CROSS/BLUE SHIELD

## 2018-08-15 ENCOUNTER — Ambulatory Visit (INDEPENDENT_AMBULATORY_CARE_PROVIDER_SITE_OTHER): Payer: BLUE CROSS/BLUE SHIELD | Admitting: Sports Medicine

## 2018-08-15 DIAGNOSIS — M25561 Pain in right knee: Secondary | ICD-10-CM | POA: Diagnosis not present

## 2018-08-15 DIAGNOSIS — M25562 Pain in left knee: Secondary | ICD-10-CM | POA: Diagnosis not present

## 2018-08-15 DIAGNOSIS — M222X2 Patellofemoral disorders, left knee: Secondary | ICD-10-CM

## 2018-08-15 MED ORDER — CELECOXIB 200 MG PO CAPS: ORAL_CAPSULE | ORAL | 2 refills | Status: DC

## 2018-08-15 NOTE — Progress Notes (Signed)
Subjective:    CC: Left knee pain  HPI: This is a pleasant 48 year old female, for the past few months she is had pain that she localizes over the front of her left knee.  Pain is moderate, persistent, localized without radiation, no mechanical symptoms, no trauma.  Worse going up and down stairs and squatting.  I reviewed the past medical history, family history, social history, surgical history, and allergies today and no changes were needed.  Please see the problem list section below in epic for further details.  Past Medical History: Past Medical History:  Diagnosis Date  . Anxiety   . Arthritis   . Complication of anesthesia    per patient had to be encourgaged multiple times to breathe deeply; ; likely low O2 saturation ; states " i think i was still just too drugged up   . Depression   . GERD (gastroesophageal reflux disease)   . History of kidney stones   . Sleep apnea    no CPAP use yet ; hasnt gotten it    Past Surgical History: Past Surgical History:  Procedure Laterality Date  . CHOLECYSTECTOMY  09/10/1995  . LAPAROSCOPIC GASTRIC SLEEVE RESECTION N/A 02/08/2017   Procedure: LAPAROSCOPIC GASTRIC SLEEVE RESECTION, UPPER ENDO;  Surgeon: Luretha Murphy, MD;  Location: [facility];  Service: General;  Laterality: N/A;  . TONSILLECTOMY    . TOTAL VAGINAL HYSTERECTOMY  06/10/2011  . TUBAL LIGATION  03/10/1999   Social History: Social History   Socioeconomic History  . Marital status: Married    Spouse name: Not on file  . Number of children: Not on file  . Years of education: Not on file  . Highest education level: Not on file  Occupational History  . Not on file  Social Needs  . Financial resource strain: Not on file  . Food insecurity:    Worry: Not on file    Inability: Not on file  . Transportation needs:    Medical: Not on file    Non-medical: Not on file  Tobacco Use  . Smoking status: Former Smoker    Packs/day: 1.00    Years: 26.00    Pack years: 26.00      Types: Cigarettes    Start date: 07/19/1990    Last attempt to quit: 11/25/2016    Years since quitting: 1.7  . Smokeless tobacco: Never Used  Substance and Sexual Activity  . Alcohol use: Yes    Alcohol/week: 3.0 standard drinks    Types: 3 Standard drinks or equivalent per week  . Drug use: No  . Sexual activity: Yes    Partners: Female    Birth control/protection: Other-see comments  Lifestyle  . Physical activity:    Days per week: Not on file    Minutes per session: Not on file  . Stress: Not on file  Relationships  . Social connections:    Talks on phone: Not on file    Gets together: Not on file    Attends religious service: Not on file    Active member of club or organization: Not on file    Attends meetings of clubs or organizations: Not on file    Relationship status: Not on file  Other Topics Concern  . Not on file  Social History Narrative  . Not on file   Family History: Family History  Problem Relation Age of Onset  . Hyperlipidemia Mother   . Hypertension Mother   . Hyperlipidemia Father   . Hypertension  Father   . Cancer Sister        Colon  . Cancer Maternal Grandmother   . Cancer Paternal Grandmother    Allergies: Allergies  Allergen Reactions  . Codeine Nausea And Vomiting   Medications: See med rec.  Review of Systems: No fevers, chills, night sweats, weight loss, chest pain, or shortness of breath.   Objective:    General: Well Developed, well nourished, and in no acute distress.  Neuro: Alert and oriented x3, extra-ocular muscles intact, sensation grossly intact.  HEENT: Normocephalic, atraumatic, pupils equal round reactive to light, neck supple, no masses, no lymphadenopathy, thyroid nonpalpable.  Skin: Warm and dry, no rashes. Cardiac: Regular rate and rhythm, no murmurs rubs or gallops, no lower extremity edema.  Respiratory: Clear to auscultation bilaterally. Not using accessory muscles, speaking in full sentences. Left  knee: Normal to inspection with no erythema or effusion or obvious bony abnormalities. Tender palpation at the patellar facets and at the medial joint line. ROM normal in flexion and extension and lower leg rotation. Ligaments with solid consistent endpoints including ACL, PCL, LCL, MCL. Negative Mcmurray's and provocative meniscal tests. Non painful patellar compression. Patellar and quadriceps tendons unremarkable. Hamstring and quadriceps strength is normal.  Impression and Recommendations:    Patellofemoral syndrome, left X-rays, Celebrex, formal PT. Return to see me in 6 weeks. ___________________________________________ Ihor Austin. Benjamin Stain, M.D., ABFM., CAQSM. Primary Care and Sports Medicine [facility]  Adjunct Professor of Family Medicine  [employer]

## 2018-08-15 NOTE — Assessment & Plan Note (Signed)
X-rays, Celebrex, formal PT. Return to see me in 6 weeks.

## 2018-08-21 ENCOUNTER — Ambulatory Visit: Payer: BLUE CROSS/BLUE SHIELD | Admitting: Rehabilitative and Restorative Service Providers"

## 2018-09-05 ENCOUNTER — Other Ambulatory Visit: Payer: Self-pay | Admitting: Sports Medicine

## 2018-09-05 DIAGNOSIS — F419 Anxiety disorder, unspecified: Principal | ICD-10-CM

## 2018-09-05 DIAGNOSIS — F329 Major depressive disorder, single episode, unspecified: Secondary | ICD-10-CM

## 2018-09-26 ENCOUNTER — Ambulatory Visit: Payer: BLUE CROSS/BLUE SHIELD | Admitting: Sports Medicine

## 2018-10-02 ENCOUNTER — Other Ambulatory Visit: Payer: Self-pay | Admitting: Sports Medicine

## 2018-10-31 ENCOUNTER — Other Ambulatory Visit: Payer: Self-pay | Admitting: Sports Medicine

## 2018-10-31 DIAGNOSIS — Z9884 Bariatric surgery status: Secondary | ICD-10-CM

## 2018-10-31 DIAGNOSIS — F419 Anxiety disorder, unspecified: Secondary | ICD-10-CM

## 2018-10-31 DIAGNOSIS — F329 Major depressive disorder, single episode, unspecified: Secondary | ICD-10-CM

## 2018-12-07 ENCOUNTER — Other Ambulatory Visit: Payer: Self-pay | Admitting: Sports Medicine

## 2018-12-07 DIAGNOSIS — F32A Depression, unspecified: Secondary | ICD-10-CM

## 2018-12-07 DIAGNOSIS — F329 Major depressive disorder, single episode, unspecified: Secondary | ICD-10-CM

## 2018-12-07 DIAGNOSIS — F419 Anxiety disorder, unspecified: Principal | ICD-10-CM

## 2018-12-07 MED ORDER — DULOXETINE HCL 60 MG PO CPEP: 60.0000 mg | ORAL_CAPSULE | Freq: Every day | ORAL | 1 refills | Status: DC

## 2018-12-10 ENCOUNTER — Other Ambulatory Visit: Payer: Self-pay | Admitting: Sports Medicine

## 2018-12-10 DIAGNOSIS — Z9884 Bariatric surgery status: Secondary | ICD-10-CM

## 2018-12-30 ENCOUNTER — Other Ambulatory Visit: Payer: Self-pay | Admitting: Sports Medicine

## 2018-12-30 DIAGNOSIS — K219 Gastro-esophageal reflux disease without esophagitis: Secondary | ICD-10-CM

## 2019-01-06 ENCOUNTER — Other Ambulatory Visit: Payer: Self-pay | Admitting: Sports Medicine

## 2019-01-06 DIAGNOSIS — F419 Anxiety disorder, unspecified: Secondary | ICD-10-CM

## 2019-01-06 DIAGNOSIS — F329 Major depressive disorder, single episode, unspecified: Secondary | ICD-10-CM

## 2019-01-09 ENCOUNTER — Other Ambulatory Visit: Payer: Self-pay | Admitting: Sports Medicine

## 2019-01-09 DIAGNOSIS — F329 Major depressive disorder, single episode, unspecified: Secondary | ICD-10-CM

## 2019-01-09 DIAGNOSIS — F419 Anxiety disorder, unspecified: Secondary | ICD-10-CM

## 2019-02-06 ENCOUNTER — Other Ambulatory Visit: Payer: Self-pay | Admitting: Sports Medicine

## 2019-02-06 DIAGNOSIS — F419 Anxiety disorder, unspecified: Secondary | ICD-10-CM

## 2019-02-06 DIAGNOSIS — F329 Major depressive disorder, single episode, unspecified: Secondary | ICD-10-CM

## 2019-02-07 ENCOUNTER — Encounter: Payer: Self-pay | Admitting: Sports Medicine

## 2019-02-07 ENCOUNTER — Ambulatory Visit (INDEPENDENT_AMBULATORY_CARE_PROVIDER_SITE_OTHER): Payer: BC Managed Care – PPO | Admitting: Sports Medicine

## 2019-02-07 DIAGNOSIS — M545 Low back pain, unspecified: Secondary | ICD-10-CM

## 2019-02-07 DIAGNOSIS — M79605 Pain in left leg: Secondary | ICD-10-CM

## 2019-02-07 MED ORDER — PREDNISONE 50 MG PO TABS: ORAL_TABLET | ORAL | 0 refills | Status: DC

## 2019-02-07 NOTE — Progress Notes (Signed)
Subjective:    CC: Acute low back pain  HPI: This is a pleasant 48 year old female, for the past several weeks she is had increasing pain in the right side of her low back, worse with sitting, flexion, Valsalva, she suspect she may have some pain radiating down her leg to her knee.  In addition her blood pressure has run somewhat low, no orthostasis, she does endorse fatigue.  I reviewed the past medical history, family history, social history, surgical history, and allergies today and no changes were needed.  Please see the problem list section below in epic for further details.  Past Medical History: Past Medical History:  Diagnosis Date  . Anxiety   . Arthritis   . Complication of anesthesia    per patient had to be encourgaged multiple times to breathe deeply; ; likely low O2 saturation ; states " i think i was still just too drugged up   . Depression   . GERD (gastroesophageal reflux disease)   . History of kidney stones   . Sleep apnea    no CPAP use yet ; hasnt gotten it    Past Surgical History: Past Surgical History:  Procedure Laterality Date  . CHOLECYSTECTOMY  09/10/1995  . LAPAROSCOPIC GASTRIC SLEEVE RESECTION N/A 02/08/2017   Procedure: LAPAROSCOPIC GASTRIC SLEEVE RESECTION, UPPER ENDO;  Surgeon: Johnathan Hausen, MD;  Location: [facility];  Service: General;  Laterality: N/A;  . TONSILLECTOMY    . TOTAL VAGINAL HYSTERECTOMY  06/10/2011  . TUBAL LIGATION  03/10/1999   Social History: Social History   Socioeconomic History  . Marital status: Married    Spouse name: Not on file  . Number of children: Not on file  . Years of education: Not on file  . Highest education level: Not on file  Occupational History  . Not on file  Social Needs  . Financial resource strain: Not on file  . Food insecurity    Worry: Not on file    Inability: Not on file  . Transportation needs    Medical: Not on file    Non-medical: Not on file  Tobacco Use  . Smoking status: Former  Smoker    Packs/day: 1.00    Years: 26.00    Pack years: 26.00    Types: Cigarettes    Start date: 07/19/1990    Quit date: 11/25/2016    Years since quitting: 2.2  . Smokeless tobacco: Never Used  Substance and Sexual Activity  . Alcohol use: Yes    Alcohol/week: 3.0 standard drinks    Types: 3 Standard drinks or equivalent per week  . Drug use: No  . Sexual activity: Yes    Partners: Female    Birth control/protection: Other-see comments  Lifestyle  . Physical activity    Days per week: Not on file    Minutes per session: Not on file  . Stress: Not on file  Relationships  . Social Herbalist on phone: Not on file    Gets together: Not on file    Attends religious service: Not on file    Active member of club or organization: Not on file    Attends meetings of clubs or organizations: Not on file    Relationship status: Not on file  Other Topics Concern  . Not on file  Social History Narrative  . Not on file   Family History: Family History  Problem Relation Age of Onset  . Hyperlipidemia Mother   . Hypertension  Mother   . Hyperlipidemia Father   . Hypertension Father   . Cancer Sister        Colon  . Cancer Maternal Grandmother   . Cancer Paternal Grandmother    Allergies: Allergies  Allergen Reactions  . Codeine Nausea And Vomiting   Medications: See med rec.  Review of Systems: No fevers, chills, night sweats, weight loss, chest pain, or shortness of breath.   Objective:    General: Well Developed, well nourished, and in no acute distress.  Neuro: Alert and oriented x3, extra-ocular muscles intact, sensation grossly intact.  HEENT: Normocephalic, atraumatic, pupils equal round reactive to light, neck supple, no masses, no lymphadenopathy, thyroid nonpalpable.  Skin: Warm and dry, no rashes. Cardiac: Regular rate and rhythm, no murmurs rubs or gallops, no lower extremity edema.  Respiratory: Clear to auscultation bilaterally. Not using  accessory muscles, speaking in full sentences. Back Exam:  Inspection: Unremarkable  Motion: Flexion 45 deg, Extension 45 deg, Side Bending to 45 deg bilaterally,  Rotation to 45 deg bilaterally  SLR laying: Negative  XSLR laying: Negative  Palpable tenderness: Right sacroiliac joint. FABER: negative. Sensory change: Gross sensation intact to all lumbar and sacral dermatomes.  Reflexes: 2+ at both patellar tendons, 2+ at achilles tendons, Babinski's downgoing.  Strength at foot  Plantar-flexion: 5/5 Dorsi-flexion: 5/5 Eversion: 5/5 Inversion: 5/5  Leg strength  Quad: 5/5 Hamstring: 5/5 Hip flexor: 5/5 Hip abductors: 5/5  Gait unremarkable.  X-rays personally reviewed from 2018, overall unremarkable.  Impression and Recommendations:    Low back pain radiating to both legs Acute low back pain, right-sided over the SI joint. Also worse with sitting, flexion, Valsalva suspicious for discogenic pain. Adding a burst of prednisone, formal PT. Return to see me in 6 weeks, x-rays and an MRI for interventional planning if no better. Blood pressure was a touch low today but she is really asymptomatic, increase fluid intake and check her blood pressure at home.   ___________________________________________ Ihor Austinhomas J. Benjamin Stainhekkekandam, M.D., ABFM., CAQSM. Primary Care and Sports Medicine [facility]  Adjunct Professor of Family Medicine  [employer]

## 2019-02-07 NOTE — Assessment & Plan Note (Signed)
Acute low back pain, right-sided over the SI joint. Also worse with sitting, flexion, Valsalva suspicious for discogenic pain. Adding a burst of prednisone, formal PT. Return to see me in 6 weeks, x-rays and an MRI for interventional planning if no better. Blood pressure was a touch low today but she is really asymptomatic, increase fluid intake and check her blood pressure at home.

## 2019-02-15 ENCOUNTER — Other Ambulatory Visit: Payer: Self-pay

## 2019-02-15 ENCOUNTER — Ambulatory Visit (INDEPENDENT_AMBULATORY_CARE_PROVIDER_SITE_OTHER): Payer: BC Managed Care – PPO | Admitting: Rehabilitative and Restorative Service Providers"

## 2019-02-15 ENCOUNTER — Encounter: Payer: Self-pay | Admitting: Rehabilitative and Restorative Service Providers"

## 2019-02-15 DIAGNOSIS — M545 Low back pain, unspecified: Secondary | ICD-10-CM

## 2019-02-15 DIAGNOSIS — R29898 Other symptoms and signs involving the musculoskeletal system: Secondary | ICD-10-CM

## 2019-02-15 NOTE — Therapy (Signed)
[facility] [address] [place], [place], [address] Phone: [phone]   Fax:  [phone]  Physical Therapy Evaluation  Patient Details  Name: Rita GianottiJennifer Elaine Bentley MRN: [id number] Date of Birth: 1971/06/01 Referring Provider (PT): Dr Benjamin Stainhekkekandam    Encounter Date: 02/15/2019  PT End of Session - 02/15/19 1450    Visit Number  1    Number of Visits  12    Date for PT Re-Evaluation  03/29/19    PT Start Time  1450    PT Stop Time  1540    PT Time Calculation (min)  50 min    Activity Tolerance  Patient tolerated treatment well       Past Medical History:  Diagnosis Date  . Anxiety   . Arthritis   . Complication of anesthesia    per patient had to be encourgaged multiple times to breathe deeply; ; likely low O2 saturation ; states " i think i was still just too drugged up   . Depression   . GERD (gastroesophageal reflux disease)   . History of kidney stones   . Sleep apnea    no CPAP use yet ; hasnt gotten it     Past Surgical History:  Procedure Laterality Date  . CHOLECYSTECTOMY  09/10/1995  . LAPAROSCOPIC GASTRIC SLEEVE RESECTION N/A 02/08/2017   Procedure: LAPAROSCOPIC GASTRIC SLEEVE RESECTION, UPPER ENDO;  Surgeon: Luretha MurphyMartin, Matthew, MD;  Location: [facility];  Service: General;  Laterality: N/A;  . TONSILLECTOMY    . TOTAL VAGINAL HYSTERECTOMY  06/10/2011  . TUBAL LIGATION  03/10/1999    There were no vitals filed for this visit.   Subjective Assessment - 02/15/19 1455    Subjective  Patient reports that she has had LBP "all her lift" (since she was a teenager. She experienced Rt knee pain ~ 2-3 weeks ago which lasted~ 1 wk. She awoke one morning with no more knee pain but had severe LBP Rt > Lt which has persisted. Oralprednisone has helped some but pain persists.    Pertinent History  Chronic LBP; 3 children vaginal delivery; hysterectomy 2000    Patient Stated Goals  get rid of pain    Currently  in Pain?  Yes    Pain Score  7     Pain Location  Back    Pain Orientation  Right;Left;Lower    Pain Descriptors / Indicators  Stabbing;Throbbing    Pain Type  Acute pain;Chronic pain    Pain Radiating Towards  into Rt hip    Pain Onset  1 to 4 weeks ago    Pain Frequency  Constant    Aggravating Factors   any movement; driving; riding; twisting; walking    Pain Relieving Factors  meds; heating pad some help         [facility] PT Assessment - 02/15/19 0001      Assessment   Medical Diagnosis  Rt LBP; Rt SI pain     Referring Provider (PT)  Dr Benjamin Stainhekkekandam     Onset Date/Surgical Date  01/27/19   LBP since 20's    Hand Dominance  Right    Next MD Visit  03/21/2019    Prior Therapy  none      Precautions   Precautions  None      Restrictions   Weight Bearing Restrictions  No      Balance Screen   Has the patient fallen in the past 6 months  No  Has the patient had a decrease in activity level because of a fear of falling?   No    Is the patient reluctant to leave their home because of a fear of falling?   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 lifting 2 oz to 50 pounds -  10 yrs similar work for 10 yrs before     Leisure  household chores; cooking; grandchildren - favorite chair is Medical illustratorrecliner       Observation/Other Assessments   Focus on Therapeutic Outcomes (FOTO)   56% limitation       Sensation   Additional Comments  WFL's per pt report       Posture/Postural Control   Posture/Postural Control  --   stands with Lt > Rt knees in hyperextension    Posture Comments  head forward; shoulders rounded       AROM   Overall AROM Comments  bilat LE ROM WNL's     Lumbar Flexion  50% pain Rt LB    Lumbar Extension  50%     Lumbar - Right Side Bend  60% pain Rt LB    Lumbar - Left Side Bend  55% pain Rt LB     Lumbar - Right Rotation  45%    Lumbar - Left Rotation  45%       Strength   Overall Strength  --   weak lumbar core    Overall Strength Comments  functional bilat LE's      Flexibility   Hamstrings  tight Rt > Lt - WFL's bilat     Quadriceps  tight Rt > Lt     ITB  WFL's bilat     Piriformis  tight Rt > Lt       Palpation   Spinal mobility  tight and tender to lumbar spring testing lower > mid to upper lumbar     SI assessment   painful Rt ~ symmetrical     Palpation comment  muscular tightness Rt QL; lumbar paraspinals; Rt posterior hip - piriformis/glut min/glut med/glut max       Special Tests   Other special tests  (-) SLR; (-) slump test       Transfers   Comments  antalgic movement patterns with all transfers       Ambulation/Gait   Gait Comments  slow antalgic gait limp Rt LE                 Objective measurements completed on examination: See above findings.      [facility] Adult PT Treatment/Exercise - 02/15/19 0001      Self-Care   Self-Care  --   iniitated back care education      Therapeutic Activites    Therapeutic Activities  --   myofacial ball relesae work standing      Lumbar Exercises: Stretches   Press Ups  10 reps   2-3 sec hold no pain      Lumbar Exercises: Supine   Other Supine Lumbar Exercises  4 part core - some discomfort w/ lumbar contraction - encouraged pt to grade contraction to prevent pain       Moist Heat Therapy   Number Minutes Moist Heat  15 Minutes    Moist  Heat Location  Lumbar Spine;Hip   Rt      Electrical Stimulation   Electrical Stimulation Location  Rt lumbar     Electrical Stimulation Action  IFC    Electrical Stimulation Parameters  to tolerance    Electrical Stimulation Goals  Pain;Tone             PT Education - 02/15/19 1550    Education Details  HEP ; POC; TENS; back care    Person(s) Educated  Patient    Methods  Explanation;Demonstration;Tactile cues;Verbal cues;Handout    Comprehension   Verbalized understanding;Returned demonstration;Verbal cues required;Tactile cues required          PT Long Term Goals - 02/15/19 1754      PT LONG TERM GOAL #1   Title  Decrease pain in the Rt SI and LB/ hip area by 50-75% allowing patient to participate in exercise and perform functional and wokr tasks with greater ease 03/29/2019    Time  6    Period  Weeks    Status  New      PT LONG TERM GOAL #2   Title  Improve core stability and strength allowing patient to sit; stand; walk for 10-15 min without pain or limitations 03/29/2019    Time  6    Period  Weeks    Status  New      PT LONG TERM GOAL #3   Title  Patient independent in HEP 03/29/2019    Time  6    Period  Weeks    Status  New      PT LONG TERM GOAL #4   Title  Improve FOTO to </= 34% limitation 03/29/2019    Time  6    Period  Weeks    Status  New             Plan - 02/15/19 1555    Clinical Impression Statement  Pt presents with 3-4 wk history of Rt LB and posterior hip pain. She reports limping for a few weeks due to Rt knee pain prior to onset of severe LBP. She has poor posture and alignment; limited and painful trunk mobility and ROM; weak core; pain with palpation Rt lumbar/SI/posterior hip musculature; pain with functional activities. Patient will benefit from PT to address problems identified.    Stability/Clinical Decision Making  Stable/Uncomplicated    Clinical Decision Making  Low    PT Frequency  2x / week    PT Duration  6 weeks    PT Treatment/Interventions  Patient/family education;ADLs/Self Care Home Management;Cryotherapy;Electrical Stimulation;Iontophoresis 4mg /ml Dexamethasone;Moist Heat;Ultrasound;Manual techniques;Neuromuscular re-education;Dry needling;Therapeutic activities;Therapeutic exercise    PT Next Visit Plan  review HEP; further assess SI as indicated; myofacial ball relase work; stretching piriformis and hip musculature; manual work for LB and Rt posterior hip; core  stailization; back care education; modaliites as indicated    PT Home Exercise Plan  [phone]       Patient will benefit from skilled therapeutic intervention in order to improve the following deficits and impairments:  Pain, Increased fascial restricitons, Increased muscle spasms, Decreased strength, Decreased mobility, Decreased range of motion, Decreased activity tolerance  Visit Diagnosis: 1. Acute right-sided low back pain, unspecified whether sciatica present   2. Other symptoms and signs involving the musculoskeletal system        Problem List Patient Active Problem List   Diagnosis Date Noted  . Patellofemoral syndrome, left 08/15/2018  . Atypical chest pain 04/27/2018  . Forgetfulness 04/04/2018  .  GERD (gastroesophageal reflux disease) 11/21/2017  . Insomnia 05/13/2017  . Low back pain radiating to both legs 04/15/2017  . 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 Smoker 11/08/2016  . Palpitations 08/16/2016  . Annual physical exam 07/19/2016  . Anxiety and depression 07/19/2016  . Surgical menopause 07/19/2016    Mahoganie Basher Nilda Simmer PT, MPH  02/15/2019, 6:11 PM  [facility] [address] [place], [place], [address] Phone: [phone]   Fax:  [phone]  Name: Khalie Wince MRN: [id number] Date of Birth: 23-Oct-1970

## 2019-02-15 NOTE — Patient Instructions (Signed)
Access Code: [email or web address]  URL: [email or web address]  Date: 02/15/2019  Prepared by: Gillermo Murdoch   Exercises  Prone Press Up - 10 reps - 1 sets - 2-3 sec hold - 2x daily - 7x weekly  Supine Transversus Abdominis Bracing - Hands on Stomach - 10 reps - 1 sets - 10 sec hold - 2x daily - 7x weekly  Patient Education  TENS Unit  Posture and Body Mechanics

## 2019-02-19 ENCOUNTER — Encounter: Payer: BC Managed Care – PPO | Admitting: Physical Therapy

## 2019-02-21 ENCOUNTER — Ambulatory Visit (INDEPENDENT_AMBULATORY_CARE_PROVIDER_SITE_OTHER): Payer: BC Managed Care – PPO | Admitting: Physical Therapy

## 2019-02-21 ENCOUNTER — Other Ambulatory Visit: Payer: Self-pay

## 2019-02-21 ENCOUNTER — Encounter: Payer: Self-pay | Admitting: Physical Therapy

## 2019-02-21 DIAGNOSIS — R29898 Other symptoms and signs involving the musculoskeletal system: Secondary | ICD-10-CM | POA: Diagnosis not present

## 2019-02-21 DIAGNOSIS — M545 Low back pain, unspecified: Secondary | ICD-10-CM

## 2019-02-21 NOTE — Therapy (Addendum)
[facility] [place], [place], [address] Phone: [phone]   Fax:  [phone]  Physical Therapy Treatment  Patient Details  Name: Rita Bentley MRN: [id number] Date of Birth: March 23, 1971 Referring Provider (PT): Dr Dianah Field    Encounter Date: 02/21/2019  PT End of Session - 02/21/19 0810    Visit Number  2    Number of Visits  12    Date for PT Re-Evaluation  03/29/19    PT Start Time  0805    PT Stop Time  0845    PT Time Calculation (min)  40 min    Activity Tolerance  Patient tolerated treatment well;No increased pain    Behavior During Therapy  WFL for tasks assessed/performed       Past Medical History:  Diagnosis Date  . Anxiety   . Arthritis   . Complication of anesthesia    per patient had to be encourgaged multiple times to breathe deeply; ; likely low O2 saturation ; states " i think i was still just too drugged up   . Depression   . GERD (gastroesophageal reflux disease)   . History of kidney stones   . Sleep apnea    no CPAP use yet ; hasnt gotten it     Past Surgical History:  Procedure Laterality Date  . CHOLECYSTECTOMY  09/10/1995  . LAPAROSCOPIC GASTRIC SLEEVE RESECTION N/A 02/08/2017   Procedure: LAPAROSCOPIC GASTRIC SLEEVE RESECTION, UPPER ENDO;  Surgeon: Johnathan Hausen, MD;  Location: [facility];  Service: General;  Laterality: N/A;  . TONSILLECTOMY    . TOTAL VAGINAL HYSTERECTOMY  06/10/2011  . TUBAL LIGATION  03/10/1999    There were no vitals filed for this visit.  Subjective Assessment - 02/21/19 0811    Subjective  Pt reports she accidentally kicked a bucket with her foot and feels she may have broken her piggy toe.  She bought a TENS unit and has been using daily; this has helped tremendously.    Pertinent History  Chronic LBP; 3 children vaginal delivery; hysterectomy 2000    Patient Stated Goals  get rid of pain    Currently in Pain?  Yes    Pain  Score  2     Pain Location  Back    Pain Orientation  Right;Lower    Pain Descriptors / Indicators  Dull;Aching    Pain Onset  1 to 4 weeks ago    Aggravating Factors   riding, twisting, walking    Pain Relieving Factors  TENS, heating pad    Multiple Pain Sites  Yes    Pain Score  5    Pain Location  Toe (Comment which one)    Pain Orientation  Right    Pain Descriptors / Indicators  Sharp;Throbbing    Aggravating Factors   walking    Pain Relieving Factors  ice         [facility] PT Assessment - 02/21/19 0001      Assessment   Medical Diagnosis  Rt LBP; Rt SI pain     Referring Provider (PT)  Dr Dianah Field     Onset Date/Surgical Date  01/27/19   LBP since 20's    Hand Dominance  Right    Next MD Visit  03/21/2019    Prior Therapy  none      [facility] Adult PT Treatment/Exercise - 02/21/19 0001      Lumbar Exercises: Stretches   Passive Hamstring Stretch  Right;Left;2 reps;30  seconds    Passive Hamstring Stretch Limitations  limited tolerance for hooklying position    Press Ups  10 reps   2-3 sec hold no pain    Piriformis Stretch  Right;2 reps;10 seconds   trial in sitting, supine; limited tolerance   Other Lumbar Stretch Exercise  modified piriformis stretch per HEP x 30 sec (tolerated well)      Lumbar Exercises: Aerobic   Nustep  L4: 5.5 min       Lumbar Exercises: Seated   Other Seated Lumbar Exercises  core engagement x 10 sec x 1 reps for HEP.       Lumbar Exercises: Sidelying   Clam  Right;10 reps    Hip Abduction  Right;10 reps;5 reps    Other Sidelying Lumbar Exercises  core engagement x 5 sec x 5 reps      Modalities   Modalities  --   declined; will use at home.      Manual Therapy   Manual Therapy  Soft tissue mobilization    Manual therapy comments  pt prone    Soft tissue mobilization  IASTM to Rt low back and upper glute,  STM to glute/ piriformis/ low back                   PT Long Term Goals - 02/15/19 1754      PT LONG TERM GOAL  #1   Title  Decrease pain in the Rt SI and LB/ hip area by 50-75% allowing patient to participate in exercise and perform functional and wokr tasks with greater ease 03/29/2019    Time  6    Period  Weeks    Status  New      PT LONG TERM GOAL #2   Title  Improve core stability and strength allowing patient to sit; stand; walk for 10-15 min without pain or limitations 03/29/2019    Time  6    Period  Weeks    Status  New      PT LONG TERM GOAL #3   Title  Patient independent in HEP 03/29/2019    Time  6    Period  Weeks    Status  New      PT LONG TERM GOAL #4   Title  Improve FOTO to </= 34% limitation 03/29/2019    Time  6    Period  Weeks    Status  New            Plan - 02/21/19 0900    Clinical Impression Statement  Improved pain level with use of TENS unit at home.  Pt did not tolerate supiine/hooklying position well; much improved in prone and sidelying.  Pt reported quick fatigue of Rt hip with strengthening.  Modified HEP today.  Pt reported reduction of pain in hip/low back after manual therapy. Progressing towards goals.    Stability/Clinical Decision Making  Stable/Uncomplicated    PT Frequency  2x / week    PT Duration  6 weeks    PT Treatment/Interventions  Patient/family education;ADLs/Self Care Home Management;Cryotherapy;Electrical Stimulation;Iontophoresis 64m/ml Dexamethasone;Moist Heat;Ultrasound;Manual techniques;Neuromuscular re-education;Dry needling;Therapeutic activities;Therapeutic exercise    PT Next Visit Plan  continue back care education, manual to hip / low back;  spine stabilization/hip strengthening.    PT Home Exercise Plan  [phone]   Consulted and Agree with Plan of Care  Patient       Patient will benefit from skilled therapeutic intervention in order to  improve the following deficits and impairments:  Pain, Increased fascial restricitons, Increased muscle spasms, Decreased strength, Decreased mobility, Decreased range of motion, Decreased  activity tolerance  Visit Diagnosis: 1. Acute right-sided low back pain, unspecified whether sciatica present   2. Other symptoms and signs involving the musculoskeletal system        Problem List Patient Active Problem List   Diagnosis Date Noted  . Patellofemoral syndrome, left 08/15/2018  . Atypical chest pain 04/27/2018  . Forgetfulness 04/04/2018  . GERD (gastroesophageal reflux disease) 11/21/2017  . Insomnia 05/13/2017  . Low back pain radiating to both legs 04/15/2017  . 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 Smoker 11/08/2016  . Palpitations 08/16/2016  . Annual physical exam 07/19/2016  . Anxiety and depression 07/19/2016  . Surgical menopause 07/19/2016   Kerin Perna, PTA 02/21/19 9:59 AM  [place], [place], [address] Phone: [phone]   Fax:  [phone]  Name: Genise Strack MRN: [id number] Date of Birth: October 24, 1970  PHYSICAL THERAPY DISCHARGE SUMMARY  Visits from Start of Care: 2  Current functional level related to goals / functional outcomes: See last progress note for discharge status    Remaining deficits: Unknown    Education / Equipment: HEP  Plan: Patient agrees to discharge.  Patient goals were not met. Patient is being discharged due to not returning since the last visit.  ?????     Celyn P. Helene Kelp PT, MPH 04/05/19 8:20 AM

## 2019-02-23 ENCOUNTER — Encounter: Payer: BC Managed Care – PPO | Admitting: Physical Therapy

## 2019-02-26 ENCOUNTER — Encounter: Payer: BC Managed Care – PPO | Admitting: Physical Therapy

## 2019-02-28 ENCOUNTER — Encounter: Payer: BC Managed Care – PPO | Admitting: Physical Therapy

## 2019-03-21 ENCOUNTER — Ambulatory Visit: Payer: BC Managed Care – PPO | Admitting: Sports Medicine

## 2019-04-08 ENCOUNTER — Other Ambulatory Visit: Payer: Self-pay | Admitting: Sports Medicine

## 2019-04-08 DIAGNOSIS — F329 Major depressive disorder, single episode, unspecified: Secondary | ICD-10-CM

## 2019-04-08 DIAGNOSIS — F32A Depression, unspecified: Secondary | ICD-10-CM

## 2019-08-20 ENCOUNTER — Encounter (HOSPITAL_COMMUNITY): Payer: Self-pay

## 2019-12-09 ENCOUNTER — Other Ambulatory Visit: Payer: Self-pay | Admitting: Sports Medicine

## 2019-12-09 DIAGNOSIS — F329 Major depressive disorder, single episode, unspecified: Secondary | ICD-10-CM

## 2019-12-09 DIAGNOSIS — F419 Anxiety disorder, unspecified: Secondary | ICD-10-CM

## 2019-12-09 DIAGNOSIS — F32A Depression, unspecified: Secondary | ICD-10-CM

## 2019-12-14 ENCOUNTER — Other Ambulatory Visit: Payer: Self-pay

## 2019-12-14 ENCOUNTER — Ambulatory Visit (INDEPENDENT_AMBULATORY_CARE_PROVIDER_SITE_OTHER): Payer: BC Managed Care – PPO | Admitting: Sports Medicine

## 2019-12-14 DIAGNOSIS — M7711 Lateral epicondylitis, right elbow: Secondary | ICD-10-CM

## 2019-12-14 NOTE — Progress Notes (Signed)
    Procedures performed today:    None.  Independent interpretation of notes and tests performed by another provider:   None.  Brief History, Exam, Impression, and Recommendations:    Right lateral epicondylitis This pleasant 49 year old female has had a long history of pain in her right elbow, lateral aspect, symptoms and signs are classic for tennis elbow, adding a tennis elbow brace, rehab exercises, return to see me in 4 to 6 weeks, injection if no better.    ___________________________________________ Ihor Austin. Benjamin Stain, M.D., ABFM., CAQSM. Primary Care and Sports Medicine [facility]  Adjunct Instructor of Family Medicine  [employer]

## 2019-12-14 NOTE — Assessment & Plan Note (Signed)
This pleasant 49 year old female has had a long history of pain in her right elbow, lateral aspect, symptoms and signs are classic for tennis elbow, adding a tennis elbow brace, rehab exercises, return to see me in 4 to 6 weeks, injection if no better.

## 2020-01-11 ENCOUNTER — Ambulatory Visit: Payer: BC Managed Care – PPO | Admitting: Sports Medicine

## 2020-02-19 ENCOUNTER — Ambulatory Visit: Payer: BC Managed Care – PPO | Admitting: Sports Medicine

## 2020-07-15 ENCOUNTER — Other Ambulatory Visit: Payer: Self-pay

## 2020-07-15 ENCOUNTER — Ambulatory Visit (INDEPENDENT_AMBULATORY_CARE_PROVIDER_SITE_OTHER): Payer: BC Managed Care – PPO | Admitting: Sports Medicine

## 2020-07-15 DIAGNOSIS — K219 Gastro-esophageal reflux disease without esophagitis: Secondary | ICD-10-CM | POA: Diagnosis not present

## 2020-07-15 DIAGNOSIS — F32A Depression, unspecified: Secondary | ICD-10-CM | POA: Diagnosis not present

## 2020-07-15 DIAGNOSIS — F419 Anxiety disorder, unspecified: Secondary | ICD-10-CM | POA: Diagnosis not present

## 2020-07-15 MED ORDER — ESCITALOPRAM OXALATE 10 MG PO TABS: 10.0000 mg | ORAL_TABLET | Freq: Every day | ORAL | 3 refills | Status: DC

## 2020-07-15 MED ORDER — ALPRAZOLAM 0.5 MG PO TABS: 0.5000 mg | ORAL_TABLET | Freq: Two times a day (BID) | ORAL | 0 refills | Status: DC | PRN

## 2020-07-15 MED ORDER — OMEPRAZOLE 40 MG PO CPDR: 40.0000 mg | DELAYED_RELEASE_CAPSULE | Freq: Two times a day (BID) | ORAL | 3 refills | Status: DC

## 2020-07-15 NOTE — Assessment & Plan Note (Signed)
Rita Bentley is a pleasant 49 year old female with a history of depression, her mother recently passed and she is in the throes of severe grieving. Refilling alprazolam, adding Lexapro 10, grief counseling, return to see me in a month.

## 2020-07-15 NOTE — Progress Notes (Signed)
    Procedures performed today:    None.  Independent interpretation of notes and tests performed by another provider:   None.  Brief History, Exam, Impression, and Recommendations:    Anxiety and depression Rita Bentley is a pleasant 49 year old female with a history of depression, her mother recently passed and she is in the throes of severe grieving. Refilling alprazolam, adding Lexapro 10, grief counseling, return to see me in a month.    ___________________________________________ Ihor Austin. Benjamin Stain, M.D., ABFM., CAQSM. Primary Care and Sports Medicine [facility]  Adjunct Instructor of Family Medicine  [employer]

## 2020-08-15 ENCOUNTER — Ambulatory Visit (INDEPENDENT_AMBULATORY_CARE_PROVIDER_SITE_OTHER): Payer: BC Managed Care – PPO | Admitting: Sports Medicine

## 2020-08-15 ENCOUNTER — Other Ambulatory Visit: Payer: Self-pay

## 2020-08-15 DIAGNOSIS — F419 Anxiety disorder, unspecified: Secondary | ICD-10-CM | POA: Diagnosis not present

## 2020-08-15 DIAGNOSIS — F32A Depression, unspecified: Secondary | ICD-10-CM | POA: Diagnosis not present

## 2020-08-15 MED ORDER — BUPROPION HCL ER (XL) 150 MG PO TB24: 150.0000 mg | ORAL_TABLET | ORAL | 11 refills | Status: DC

## 2020-08-15 NOTE — Progress Notes (Signed)
    Procedures performed today:    None.  Independent interpretation of notes and tests performed by another provider:   None.  Brief History, Exam, Impression, and Recommendations:    Anxiety and depression We had been treating Victorino Dike for combination of depression and grieving, we started Lexapro but unfortunately she developed intolerable diarrhea. Today we will switch to Wellbutrin. Return to see me in a month for dose titration and PHQ/GAD.    ___________________________________________ Ihor Austin. Benjamin Stain, M.D., ABFM., CAQSM. Primary Care and Sports Medicine [facility]  Adjunct Instructor of Family Medicine  [employer]

## 2020-08-15 NOTE — Assessment & Plan Note (Signed)
We had been treating Rita Bentley for combination of depression and grieving, we started Lexapro but unfortunately she developed intolerable diarrhea. Today we will switch to Wellbutrin. Return to see me in a month for dose titration and PHQ/GAD.

## 2020-08-26 ENCOUNTER — Encounter (HOSPITAL_COMMUNITY): Payer: Self-pay

## 2020-08-28 NOTE — Telephone Encounter (Signed)
No idea, I need to see her and talk to her first virtually.  Was she vaccinated?

## 2020-09-09 ENCOUNTER — Other Ambulatory Visit: Payer: Self-pay | Admitting: Sports Medicine

## 2020-09-09 DIAGNOSIS — F32A Depression, unspecified: Secondary | ICD-10-CM

## 2020-09-24 ENCOUNTER — Other Ambulatory Visit: Payer: Self-pay | Admitting: Sports Medicine

## 2020-09-24 DIAGNOSIS — F419 Anxiety disorder, unspecified: Secondary | ICD-10-CM

## 2020-09-26 ENCOUNTER — Ambulatory Visit: Payer: BC Managed Care – PPO | Admitting: Sports Medicine

## 2020-10-06 ENCOUNTER — Other Ambulatory Visit: Payer: Self-pay | Admitting: Sports Medicine

## 2020-10-06 DIAGNOSIS — F419 Anxiety disorder, unspecified: Secondary | ICD-10-CM

## 2020-11-05 ENCOUNTER — Other Ambulatory Visit: Payer: Self-pay

## 2020-11-05 ENCOUNTER — Ambulatory Visit: Payer: BC Managed Care – PPO | Admitting: Sports Medicine

## 2020-11-05 DIAGNOSIS — M7741 Metatarsalgia, right foot: Secondary | ICD-10-CM | POA: Diagnosis not present

## 2020-11-05 DIAGNOSIS — M7742 Metatarsalgia, left foot: Secondary | ICD-10-CM

## 2020-11-05 DIAGNOSIS — Z1211 Encounter for screening for malignant neoplasm of colon: Secondary | ICD-10-CM | POA: Diagnosis not present

## 2020-11-05 NOTE — Progress Notes (Signed)
    Procedures performed today:    None.  Independent interpretation of notes and tests performed by another provider:   None.  Brief History, Exam, Impression, and Recommendations:    Metatarsalgia of both feet Solei is a pleasant 50 year old female, she has had bilateral foot pain, right worse than left localized to the second and third metatarsal heads from plantar aspect. We placed metatarsal pads in her shoes, she will return to see me if no better in about 3 to 4 weeks.    ___________________________________________ Ihor Austin. Benjamin Stain, M.D., ABFM., CAQSM. Primary Care and Sports Medicine [facility]  Adjunct Instructor of Family Medicine  [employer]

## 2020-11-05 NOTE — Assessment & Plan Note (Signed)
Rita Bentley is a pleasant 50 year old female, she has had bilateral foot pain, right worse than left localized to the second and third metatarsal heads from plantar aspect. We placed metatarsal pads in her shoes, she will return to see me if no better in about 3 to 4 weeks.

## 2020-11-16 IMAGING — DX DG KNEE COMPLETE 4+V*L*
4 series · 4 of 4 positions shown · non-contrast
Comparison: None.

CLINICAL DATA: Left knee pain

EXAM:
LEFT KNEE - COMPLETE 4+ VIEW

[knee lat]
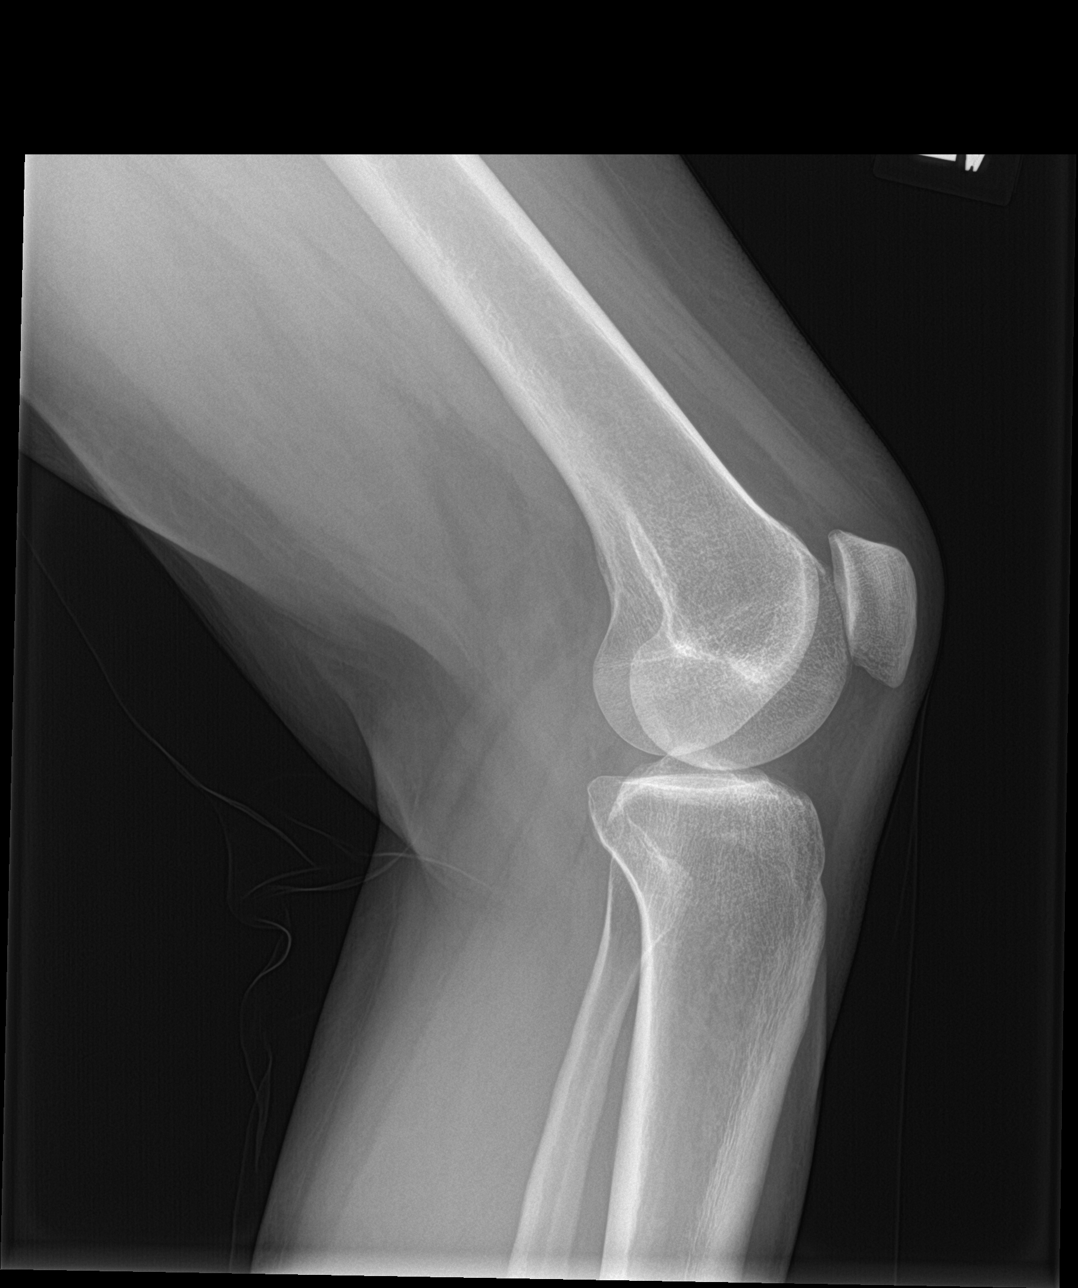

[knee sunrise]
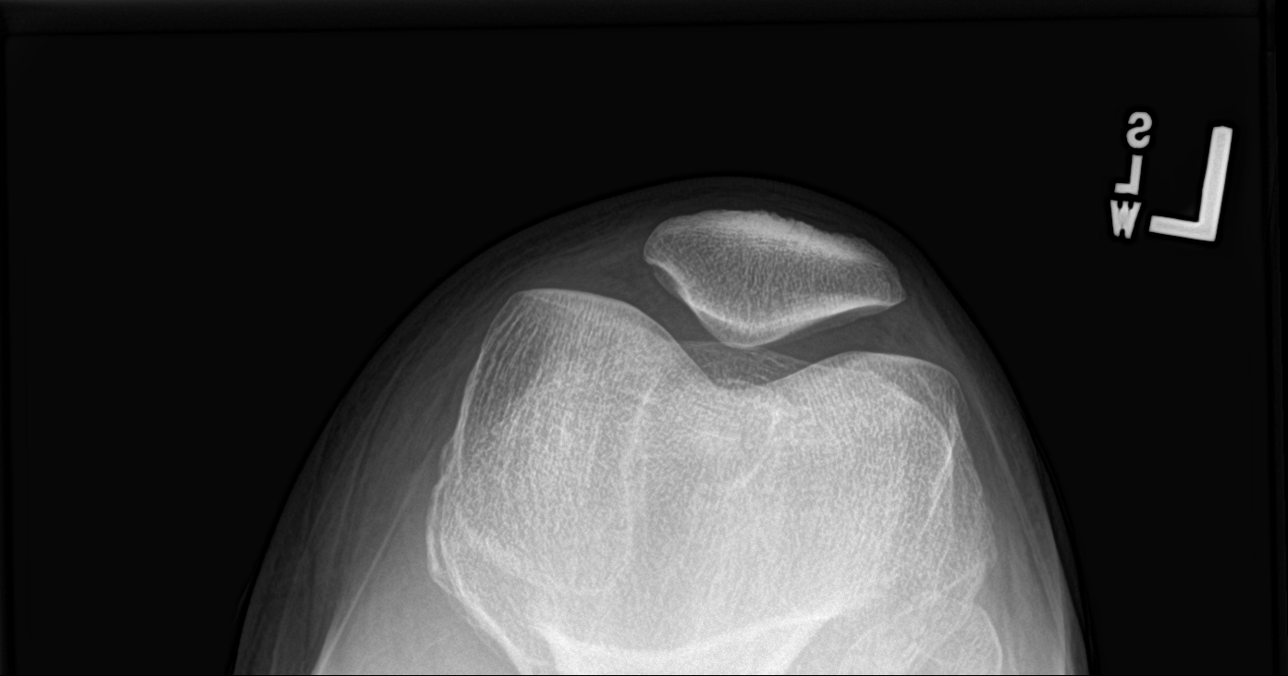

[knee ap bilat standing (1 of 2)]
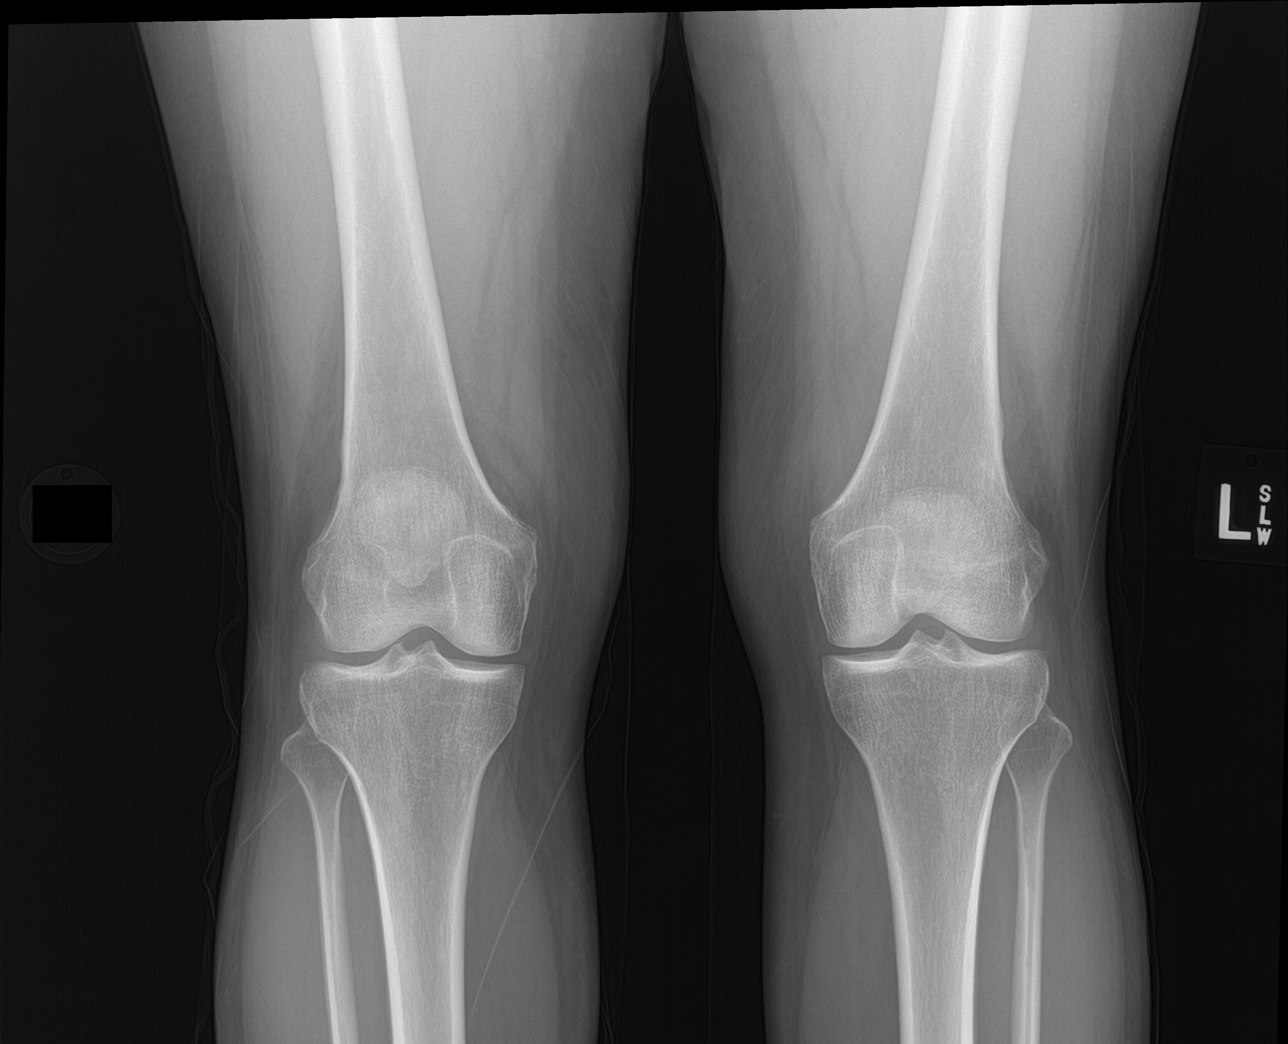

[knee ap bilat standing (2 of 2)]
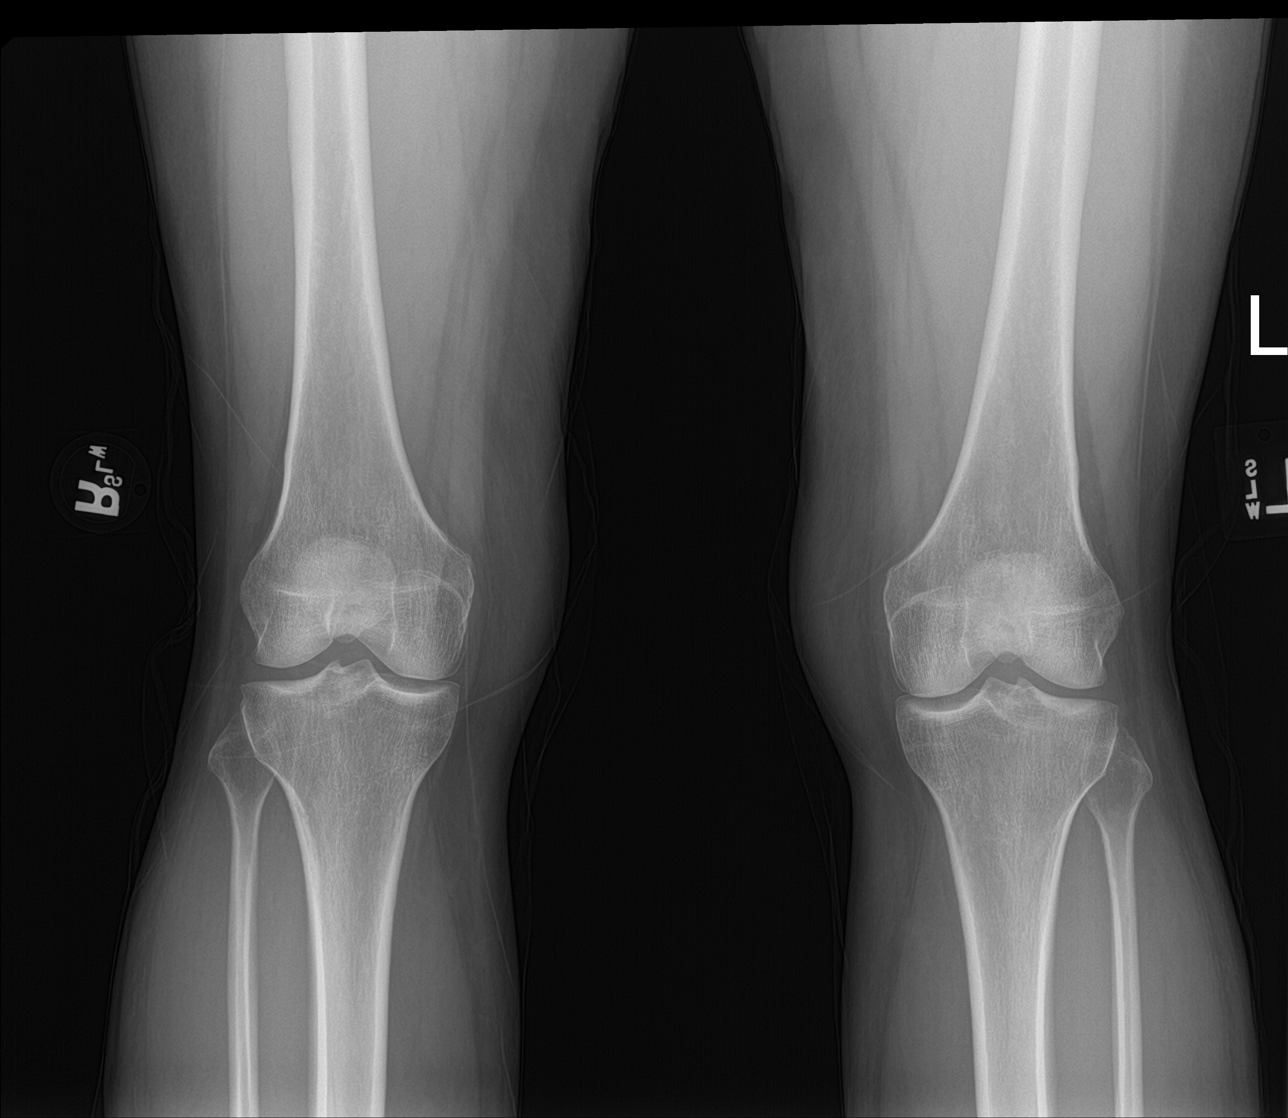

[4 of 4 positions shown; findings below may reference images not displayed]

FINDINGS: No evidence of fracture, dislocation, or joint effusion. No evidence
of arthropathy or other focal bone abnormality. Soft tissues are
unremarkable.
IMPRESSION: Negative.

## 2021-05-27 ENCOUNTER — Other Ambulatory Visit: Payer: Self-pay | Admitting: Sports Medicine

## 2021-05-27 ENCOUNTER — Ambulatory Visit (INDEPENDENT_AMBULATORY_CARE_PROVIDER_SITE_OTHER): Payer: BC Managed Care – PPO | Admitting: Sports Medicine

## 2021-05-27 DIAGNOSIS — F17200 Nicotine dependence, unspecified, uncomplicated: Secondary | ICD-10-CM | POA: Diagnosis not present

## 2021-05-27 DIAGNOSIS — R002 Palpitations: Secondary | ICD-10-CM

## 2021-05-27 DIAGNOSIS — F32A Depression, unspecified: Secondary | ICD-10-CM

## 2021-05-27 DIAGNOSIS — F419 Anxiety disorder, unspecified: Secondary | ICD-10-CM | POA: Diagnosis not present

## 2021-05-27 DIAGNOSIS — L989 Disorder of the skin and subcutaneous tissue, unspecified: Secondary | ICD-10-CM | POA: Diagnosis not present

## 2021-05-27 MED ORDER — DULOXETINE HCL 30 MG PO CPEP: 30.0000 mg | ORAL_CAPSULE | Freq: Every day | ORAL | 3 refills | Status: DC

## 2021-05-27 NOTE — Assessment & Plan Note (Signed)
Increasing depressive symptoms, no response to Wellbutrin, did not do well with Lexapro. Starting Cymbalta, return in 6 weeks for PHQ/GAD.

## 2021-05-27 NOTE — Progress Notes (Signed)
    Procedures performed today:    Procedure: Shave biopsy of 1.1 cm left forearm skin lesion Risks, benefits, and alternatives explained and consent obtained. Time out conducted. Surface prepped with alcohol. 2cc lidocaine with epinephine infiltrated in a field block. Adequate anesthesia ensured. Area prepped and draped in a sterile fashion. Excision performed with: Using a derma blade I made the shave down to the deep dermis, I then used a Hyfrecator to achieve hemostasis. Hemostasis achieved. Pt stable.  Independent interpretation of notes and tests performed by another provider:   None.  Brief History, Exam, Impression, and Recommendations:    Anxiety and depression Increasing depressive symptoms, no response to Wellbutrin, did not do well with Lexapro. Starting Cymbalta, return in 6 weeks for PHQ/GAD.  Palpitations Having palpitations, we did a Holter monitor in 2018 that showed rare PACs and PVCs. No anginal type symptoms, suspected due to anxiety. We will treat her anxiety with Cymbalta and see if things improve.  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 Shave excision today, hyfrecation, return to see me as needed.    ___________________________________________ Ihor Austin. Benjamin Stain, M.D., ABFM., CAQSM. Primary Care and Sports Medicine [facility]  Adjunct Instructor of Family Medicine  [employer]

## 2021-05-27 NOTE — Assessment & Plan Note (Signed)
Having palpitations, we did a Holter monitor in 2018 that showed rare PACs and PVCs. No anginal type symptoms, suspected due to anxiety. We will treat her anxiety with Cymbalta and see if things improve.

## 2021-05-27 NOTE — Assessment & Plan Note (Signed)
Suspect dermatofibroma. Shave excision today, hyfrecation, return to see me as needed.

## 2021-05-27 NOTE — Addendum Note (Signed)
Addended by: Carolin Coy on: 05/27/2021 03:07 PM   Modules accepted: Orders

## 2021-05-29 LAB — TSH: TSH: 3.24 mIU/L

## 2021-05-29 LAB — COMPREHENSIVE METABOLIC PANEL
AG Ratio: 2 (calc) (ref 1.0–2.5)
ALT: 22 U/L (ref 6–29)
AST: 15 U/L (ref 10–35)
Albumin: 4.3 g/dL (ref 3.6–5.1)
Alkaline phosphatase (APISO): 51 U/L (ref 31–125)
BUN: 14 mg/dL (ref 7–25)
CO2: 27 mmol/L (ref 20–32)
Calcium: 9.4 mg/dL (ref 8.6–10.2)
Chloride: 106 mmol/L (ref 98–110)
Creat: 0.7 mg/dL (ref 0.50–0.99)
Globulin: 2.1 g/dL (calc) (ref 1.9–3.7)
Glucose, Bld: 87 mg/dL (ref 65–99)
Potassium: 4.2 mmol/L (ref 3.5–5.3)
Sodium: 142 mmol/L (ref 135–146)
Total Bilirubin: 0.3 mg/dL (ref 0.2–1.2)
Total Protein: 6.4 g/dL (ref 6.1–8.1)

## 2021-05-29 LAB — CBC
HCT: 43 % (ref 35.0–45.0)
Hemoglobin: 14.4 g/dL (ref 11.7–15.5)
MCH: 31.1 pg (ref 27.0–33.0)
MCHC: 33.5 g/dL (ref 32.0–36.0)
MCV: 92.9 fL (ref 80.0–100.0)
MPV: 9.5 fL (ref 7.5–12.5)
Platelets: 291 10*3/uL (ref 140–400)
RBC: 4.63 10*6/uL (ref 3.80–5.10)
RDW: 13 % (ref 11.0–15.0)
WBC: 8.1 10*3/uL (ref 3.8–10.8)

## 2021-05-29 LAB — LIPID PANEL
Cholesterol: 203 mg/dL — ABNORMAL HIGH (ref <200)
HDL: 58 mg/dL (ref >=50)
LDL Cholesterol (Calc): 124 mg/dL (calc) — ABNORMAL HIGH
Non-HDL Cholesterol (Calc): 145 mg/dL (calc) — ABNORMAL HIGH (ref <130)
Total CHOL/HDL Ratio: 3.5 (calc) (ref <5.0)
Triglycerides: 104 mg/dL (ref <150)

## 2021-05-29 LAB — HEMOGLOBIN A1C
Hgb A1c MFr Bld: 4.9 % of total Hgb (ref <5.7)
Mean Plasma Glucose: 94 mg/dL
eAG (mmol/L): 5.2 mmol/L

## 2021-06-18 ENCOUNTER — Other Ambulatory Visit: Payer: Self-pay | Admitting: Sports Medicine

## 2021-06-18 ENCOUNTER — Other Ambulatory Visit: Payer: Self-pay

## 2021-06-18 DIAGNOSIS — F32A Depression, unspecified: Secondary | ICD-10-CM

## 2021-06-18 DIAGNOSIS — F419 Anxiety disorder, unspecified: Secondary | ICD-10-CM

## 2021-06-19 ENCOUNTER — Telehealth: Payer: Self-pay

## 2021-06-19 MED ORDER — TRAMADOL HCL 50 MG PO TABS: 50.0000 mg | ORAL_TABLET | Freq: Three times a day (TID) | ORAL | 0 refills | Status: DC | PRN

## 2021-06-19 NOTE — Telephone Encounter (Signed)
Medication: traMADol (ULTRAM) 50 MG tablet Prior authorization submitted via CoverMyMeds on 06/19/2021 PA submission pending

## 2021-06-19 NOTE — Telephone Encounter (Signed)
Medication: traMADol (ULTRAM) 50 MG tablet Prior authorization determination received Medication has been approved Approval dates: 06/19/2021-12/16/2021  Patient aware via: MyChart Pharmacy aware: Yes Provider aware via this encounter

## 2021-06-26 ENCOUNTER — Other Ambulatory Visit: Payer: Self-pay | Admitting: Sports Medicine

## 2021-06-26 DIAGNOSIS — F32A Depression, unspecified: Secondary | ICD-10-CM

## 2021-07-08 ENCOUNTER — Ambulatory Visit: Payer: BC Managed Care – PPO | Admitting: Sports Medicine

## 2021-07-28 ENCOUNTER — Other Ambulatory Visit: Payer: Self-pay | Admitting: Sports Medicine

## 2021-07-28 DIAGNOSIS — F32A Depression, unspecified: Secondary | ICD-10-CM

## 2021-09-01 ENCOUNTER — Encounter (HOSPITAL_COMMUNITY): Payer: Self-pay | Admitting: *Deleted

## 2021-11-30 ENCOUNTER — Encounter: Payer: Self-pay | Admitting: Sports Medicine

## 2021-11-30 MED ORDER — AMOXICILLIN-POT CLAVULANATE 875-125 MG PO TABS: 1.0000 | ORAL_TABLET | Freq: Two times a day (BID) | ORAL | 0 refills | Status: AC

## 2022-03-31 ENCOUNTER — Other Ambulatory Visit: Payer: Self-pay | Admitting: Sports Medicine

## 2022-03-31 DIAGNOSIS — F32A Depression, unspecified: Secondary | ICD-10-CM

## 2022-04-21 ENCOUNTER — Encounter: Payer: Self-pay | Admitting: Sports Medicine

## 2022-04-21 ENCOUNTER — Ambulatory Visit: Payer: BC Managed Care – PPO | Admitting: Sports Medicine

## 2022-04-21 DIAGNOSIS — F32A Depression, unspecified: Secondary | ICD-10-CM

## 2022-04-21 DIAGNOSIS — F419 Anxiety disorder, unspecified: Secondary | ICD-10-CM

## 2022-04-21 DIAGNOSIS — E669 Obesity, unspecified: Secondary | ICD-10-CM

## 2022-04-21 DIAGNOSIS — F5101 Primary insomnia: Secondary | ICD-10-CM

## 2022-04-21 DIAGNOSIS — K219 Gastro-esophageal reflux disease without esophagitis: Secondary | ICD-10-CM

## 2022-04-21 DIAGNOSIS — L989 Disorder of the skin and subcutaneous tissue, unspecified: Secondary | ICD-10-CM | POA: Diagnosis not present

## 2022-04-21 MED ORDER — PHENTERMINE HCL 37.5 MG PO TABS: ORAL_TABLET | ORAL | 0 refills | Status: DC

## 2022-04-21 MED ORDER — TRAZODONE HCL 50 MG PO TABS: ORAL_TABLET | ORAL | 3 refills | Status: DC

## 2022-04-21 MED ORDER — OMEPRAZOLE 40 MG PO CPDR: 40.0000 mg | DELAYED_RELEASE_CAPSULE | Freq: Two times a day (BID) | ORAL | 3 refills | Status: DC

## 2022-04-21 NOTE — Assessment & Plan Note (Signed)
Well-controlled, refilling medication. 

## 2022-04-21 NOTE — Assessment & Plan Note (Signed)
24 lb weight gain since losing her mother, would like to restart phentermine, restarting and will do monthly weight checks and refills. Checking routine labs today.

## 2022-04-21 NOTE — Assessment & Plan Note (Signed)
Small skin lesion on the very bottom of the midline of her nose, looks like a small comedone. We will watch this for a month and if still present I am happy to do some liquid nitrogen, with this location however I am less concerned about a basal cell carcinoma or squamous cell carcinoma.

## 2022-04-21 NOTE — Progress Notes (Signed)
    Procedures performed today:    None.  Independent interpretation of notes and tests performed by another provider:   None.  Brief History, Exam, Impression, and Recommendations:    GERD (gastroesophageal reflux disease) Well-controlled, refilling medication.  Insomnia Well-controlled, refilling medication.  Benign skin lesion of nose Small skin lesion on the very bottom of the midline of her nose, looks like a small comedone. We will watch this for a month and if still present I am happy to do some liquid nitrogen, with this location however I am less concerned about a basal cell carcinoma or squamous cell carcinoma.  Obesity (BMI 30-39.9) 24 lb weight gain since losing her mother, would like to restart phentermine, restarting and will do monthly weight checks and refills. Checking routine labs today.  Chronic processes with exacerbation and pharmacologic intervention  ____________________________________________ Ihor Austin. Benjamin Stain, M.D., ABFM., CAQSM., AME. Primary Care and Sports Medicine [place] [facility]  Adjunct Professor of Family Medicine  [employer] [employer]  Restaurant manager, fast food

## 2022-04-22 LAB — COMPLETE METABOLIC PANEL WITH GFR
AG Ratio: 2 (calc) (ref 1.0–2.5)
ALT: 13 U/L (ref 6–29)
AST: 15 U/L (ref 10–35)
Albumin: 4.5 g/dL (ref 3.6–5.1)
Alkaline phosphatase (APISO): 54 U/L (ref 31–125)
BUN: 12 mg/dL (ref 7–25)
CO2: 28 mmol/L (ref 20–32)
Calcium: 9.5 mg/dL (ref 8.6–10.2)
Chloride: 103 mmol/L (ref 98–110)
Creat: 0.78 mg/dL (ref 0.50–0.99)
Globulin: 2.3 g/dL (calc) (ref 1.9–3.7)
Glucose, Bld: 84 mg/dL (ref 65–99)
Potassium: 4.4 mmol/L (ref 3.5–5.3)
Sodium: 140 mmol/L (ref 135–146)
Total Bilirubin: 0.4 mg/dL (ref 0.2–1.2)
Total Protein: 6.8 g/dL (ref 6.1–8.1)
eGFR: 93 mL/min/{1.73_m2} (ref >=60)

## 2022-04-22 LAB — CBC
HCT: 43.3 % (ref 35.0–45.0)
Hemoglobin: 14.6 g/dL (ref 11.7–15.5)
MCH: 30.8 pg (ref 27.0–33.0)
MCHC: 33.7 g/dL (ref 32.0–36.0)
MCV: 91.4 fL (ref 80.0–100.0)
MPV: 9.7 fL (ref 7.5–12.5)
Platelets: 282 10*3/uL (ref 140–400)
RBC: 4.74 10*6/uL (ref 3.80–5.10)
RDW: 13 % (ref 11.0–15.0)
WBC: 7.3 10*3/uL (ref 3.8–10.8)

## 2022-04-22 LAB — LIPID PANEL
Cholesterol: 210 mg/dL — ABNORMAL HIGH (ref <200)
HDL: 58 mg/dL (ref >=50)
LDL Cholesterol (Calc): 127 mg/dL (calc) — ABNORMAL HIGH
Non-HDL Cholesterol (Calc): 152 mg/dL (calc) — ABNORMAL HIGH (ref <130)
Total CHOL/HDL Ratio: 3.6 (calc) (ref <5.0)
Triglycerides: 131 mg/dL (ref <150)

## 2022-04-22 LAB — TSH: TSH: 3.04 mIU/L

## 2022-04-22 LAB — HEMOGLOBIN A1C
Hgb A1c MFr Bld: 5.1 % of total Hgb (ref <5.7)
Mean Plasma Glucose: 100 mg/dL
eAG (mmol/L): 5.5 mmol/L

## 2022-05-19 ENCOUNTER — Ambulatory Visit: Payer: BC Managed Care – PPO | Admitting: Sports Medicine

## 2022-05-19 ENCOUNTER — Encounter: Payer: Self-pay | Admitting: Sports Medicine

## 2022-05-19 DIAGNOSIS — E669 Obesity, unspecified: Secondary | ICD-10-CM

## 2022-05-19 DIAGNOSIS — L989 Disorder of the skin and subcutaneous tissue, unspecified: Secondary | ICD-10-CM

## 2022-05-19 MED ORDER — PHENTERMINE HCL 37.5 MG PO TABS: ORAL_TABLET | ORAL | 0 refills | Status: DC

## 2022-05-19 NOTE — Assessment & Plan Note (Signed)
8 pound weight loss after the first month on phentermine, refilling medication, return in a month.

## 2022-05-19 NOTE — Assessment & Plan Note (Signed)
Persistence of a small comedone appearing skin lesion bottom of the nose midline, cryotherapy as above.

## 2022-05-19 NOTE — Progress Notes (Signed)
    Procedures performed today:    Procedure:  Cryodestruction of nose skin lesion Consent obtained and verified. Time-out conducted. Noted no overlying erythema, induration, or other signs of local infection. Completed without difficulty using Cryo-Gun. Advised to call if fevers/chills, erythema, induration, drainage, or persistent bleeding.  Independent interpretation of notes and tests performed by another provider:   None.  Brief History, Exam, Impression, and Recommendations:    Obesity (BMI 30-39.9) 8 pound weight loss after the first month on phentermine, refilling medication, return in a month.  Benign skin lesion of nose Persistence of a small comedone appearing skin lesion bottom of the nose midline, cryotherapy as above.    ____________________________________________ Gwen Her. Dianah Field, M.D., ABFM., CAQSM., AME. Primary Care and Sports Medicine [facility]  Adjunct Professor of [place] [employer] [facility] [employer] of Medicine  Risk manager

## 2022-05-28 ENCOUNTER — Encounter: Payer: Self-pay | Admitting: Sports Medicine

## 2022-06-01 ENCOUNTER — Encounter: Payer: Self-pay | Admitting: Sports Medicine

## 2022-06-01 ENCOUNTER — Ambulatory Visit: Payer: BC Managed Care – PPO | Admitting: Sports Medicine

## 2022-06-01 DIAGNOSIS — J340 Abscess, furuncle and carbuncle of nose: Secondary | ICD-10-CM | POA: Insufficient documentation

## 2022-06-01 MED ORDER — MUPIROCIN 2 % EX OINT: TOPICAL_OINTMENT | CUTANEOUS | 3 refills | Status: DC

## 2022-06-01 MED ORDER — DOXYCYCLINE HYCLATE 100 MG PO TABS: 100.0000 mg | ORAL_TABLET | Freq: Two times a day (BID) | ORAL | 0 refills | Status: AC

## 2022-06-01 NOTE — Assessment & Plan Note (Signed)
Rita Bentley has had a couple of days of a worsening painful nodule left side of the nose, now with surrounding erythema. She thinks there may have been a pimple that popped. On exam she has erythema, tenderness and an overlying goldish appearing eschar. This is consistent with a staphylococcal/MRSA infection of the face. We will treat this aggressively with topical mupirocin, doxycycline, she has tramadol for pain. If she does not really start improving by the next couple of days I would like to CT scan her maxillofacial bones.

## 2022-06-01 NOTE — Progress Notes (Signed)
    Procedures performed today:    None.  Independent interpretation of notes and tests performed by another provider:   None.  Brief History, Exam, Impression, and Recommendations:    Cellulitis of external nose Rita Bentley has had a couple of days of a worsening painful nodule left side of the nose, now with surrounding erythema. She thinks there may have been a pimple that popped. On exam she has erythema, tenderness and an overlying goldish appearing eschar. This is consistent with a staphylococcal/MRSA infection of the face. We will treat this aggressively with topical mupirocin, doxycycline, she has tramadol for pain. If she does not really start improving by the next couple of days I would like to CT scan her maxillofacial bones.    ____________________________________________ Gwen Her. Dianah Field, M.D., ABFM., CAQSM., AME. Primary Care and Sports Medicine [facility]  Adjunct Professor of [place] [employer] [facility] [employer]  Risk manager

## 2022-06-03 ENCOUNTER — Encounter: Payer: Self-pay | Admitting: Sports Medicine

## 2022-06-07 ENCOUNTER — Encounter: Payer: Self-pay | Admitting: Sports Medicine

## 2022-06-07 DIAGNOSIS — J340 Abscess, furuncle and carbuncle of nose: Secondary | ICD-10-CM

## 2022-06-07 MED ORDER — RIFAMPIN 300 MG PO CAPS: 300.0000 mg | ORAL_CAPSULE | Freq: Two times a day (BID) | ORAL | 0 refills | Status: DC

## 2022-06-16 ENCOUNTER — Ambulatory Visit: Payer: BC Managed Care – PPO | Admitting: Sports Medicine

## 2022-06-22 ENCOUNTER — Ambulatory Visit: Payer: BC Managed Care – PPO | Admitting: Sports Medicine

## 2022-06-22 DIAGNOSIS — J340 Abscess, furuncle and carbuncle of nose: Secondary | ICD-10-CM | POA: Diagnosis not present

## 2022-06-22 NOTE — Progress Notes (Signed)
    Procedures performed today:    None.  Independent interpretation of notes and tests performed by another provider:   None.  Brief History, Exam, Impression, and Recommendations:    Cellulitis of external nose Doing much better, there is a small eschar over the area of infection but erythema has resolved, pain has resolved. This did necessitate doxycycline, rifampin and topical mupirocin. When the eschar falls off she will apply topical mupirocin until it has completely epithelialized.    ____________________________________________ Ihor Austin. Benjamin Stain, M.D., ABFM., CAQSM., AME. Primary Care and Sports Medicine [place] [facility]  Adjunct Professor of Family Medicine  [place] [employer] [facility] [employer]  Restaurant manager, fast food

## 2022-06-22 NOTE — Assessment & Plan Note (Signed)
Doing much better, there is a small eschar over the area of infection but erythema has resolved, pain has resolved. This did necessitate doxycycline, rifampin and topical mupirocin. When the eschar falls off she will apply topical mupirocin until it has completely epithelialized.

## 2022-07-12 ENCOUNTER — Telehealth: Payer: Self-pay | Admitting: General Practice

## 2022-07-12 NOTE — Telephone Encounter (Signed)
Transition Care Management Follow-up Telephone Call Date of discharge and from where: 07/08/22 from [facility] How have you been since you were released from the hospital? Patient is doing better.  Any questions or concerns? No  Items Reviewed: Did the pt receive and understand the discharge instructions provided? Yes  Medications obtained and verified? Yes  Other? No  Any new allergies since your discharge? No  Dietary orders reviewed? Yes Do you have support at home? Yes   Home Care and Equipment/Supplies: Were home health services ordered? no  Functional Questionnaire: (I = Independent and D = Dependent) ADLs: I  Bathing/Dressing- I  Meal Prep- I  Eating- I  Maintaining continence- I  Transferring/Ambulation- I  Managing Meds- I  Follow up appointments reviewed:  PCP Hospital f/u appt confirmed? No   Specialist Hospital f/u appt confirmed? No  Are transportation arrangements needed? No  If their condition worsens, is the pt aware to call PCP or go to the Emergency Dept.? Yes Was the patient provided with contact information for the PCP's office or ED? Yes Was to pt encouraged to call back with questions or concerns? Yes

## 2022-07-22 ENCOUNTER — Ambulatory Visit: Payer: BC Managed Care – PPO | Admitting: Sports Medicine

## 2022-08-10 ENCOUNTER — Ambulatory Visit: Payer: BC Managed Care – PPO | Admitting: Sports Medicine

## 2022-09-03 ENCOUNTER — Encounter (HOSPITAL_COMMUNITY): Payer: Self-pay | Admitting: *Deleted

## 2022-11-22 ENCOUNTER — Ambulatory Visit: Payer: BC Managed Care – PPO | Admitting: Sports Medicine

## 2022-11-22 ENCOUNTER — Ambulatory Visit (INDEPENDENT_AMBULATORY_CARE_PROVIDER_SITE_OTHER): Payer: BC Managed Care – PPO

## 2022-11-22 DIAGNOSIS — M545 Low back pain, unspecified: Secondary | ICD-10-CM

## 2022-11-22 DIAGNOSIS — M79604 Pain in right leg: Secondary | ICD-10-CM | POA: Diagnosis not present

## 2022-11-22 DIAGNOSIS — E559 Vitamin D deficiency, unspecified: Secondary | ICD-10-CM

## 2022-11-22 DIAGNOSIS — E669 Obesity, unspecified: Secondary | ICD-10-CM

## 2022-11-22 DIAGNOSIS — M47812 Spondylosis without myelopathy or radiculopathy, cervical region: Secondary | ICD-10-CM

## 2022-11-22 DIAGNOSIS — F5101 Primary insomnia: Secondary | ICD-10-CM

## 2022-11-22 DIAGNOSIS — M79605 Pain in left leg: Secondary | ICD-10-CM

## 2022-11-22 DIAGNOSIS — E538 Deficiency of other specified B group vitamins: Secondary | ICD-10-CM

## 2022-11-22 DIAGNOSIS — Z0000 Encounter for general adult medical examination without abnormal findings: Secondary | ICD-10-CM

## 2022-11-22 MED ORDER — CELECOXIB 200 MG PO CAPS: ORAL_CAPSULE | ORAL | 2 refills | Status: DC

## 2022-11-22 NOTE — Assessment & Plan Note (Signed)
Persistent insomnia and spite of trazodone, excessive daytime sleepiness, headaches, difficulty with word finding, I do think we need to pull the trigger for a sleep study, I would also like her to touch base with neurology.

## 2022-11-22 NOTE — Assessment & Plan Note (Signed)
Neck and periscapular pain, headaches, adding cervical spine x-rays, formal PT, Celebrex as below.

## 2022-11-22 NOTE — Assessment & Plan Note (Signed)
Increasing low back pain, we will start again conservatively with x-rays, Celebrex, physical therapy. Return to see me in 6 weeks, MRI if no better, she is having right-sided radicular symptoms.

## 2022-11-22 NOTE — Progress Notes (Addendum)
    Procedures performed today:    None.  Independent interpretation of notes and tests performed by another provider:   None.  Brief History, Exam, Impression, and Recommendations:    Low back pain radiating to both legs Increasing low back pain, we will start again conservatively with x-rays, Celebrex, physical therapy. Return to see me in 6 weeks, MRI if no better, she is having right-sided radicular symptoms.  Cervical spondylosis Neck and periscapular pain, headaches, adding cervical spine x-rays, formal PT, Celebrex as below.  Insomnia Persistent insomnia and spite of trazodone, excessive daytime sleepiness, headaches, difficulty with word finding, I do think we need to pull the trigger for a sleep study, I would also like her to touch base with neurology.  Annual physical exam Vitamin D is low, B12 low normal, calling in supplement    ____________________________________________ Ihor Austin. Benjamin Stain, M.D., ABFM., CAQSM., AME. Primary Care and Sports Medicine [facility]  Adjunct Professor of Family Medicine  [place] [employer] [facility] [employer]  Restaurant manager, fast food

## 2022-11-23 LAB — CBC
MPV: 9.9 fL (ref 7.5–12.5)
Platelets: 299 10*3/uL (ref 140–400)

## 2022-11-24 LAB — LIPID PANEL
Cholesterol: 183 mg/dL (ref <200)
HDL: 59 mg/dL (ref >=50)
LDL Cholesterol (Calc): 106 mg/dL (calc) — ABNORMAL HIGH
Non-HDL Cholesterol (Calc): 124 mg/dL (calc) (ref <130)
Total CHOL/HDL Ratio: 3.1 (calc) (ref <5.0)
Triglycerides: 87 mg/dL (ref <150)

## 2022-11-24 LAB — T3, FREE: T3, Free: 3 pg/mL (ref 2.3–4.2)

## 2022-11-24 LAB — HEMOGLOBIN A1C
Hgb A1c MFr Bld: 5.3 % of total Hgb (ref <5.7)
Mean Plasma Glucose: 105 mg/dL
eAG (mmol/L): 5.8 mmol/L

## 2022-11-24 LAB — COMPREHENSIVE METABOLIC PANEL
AG Ratio: 2.3 (calc) (ref 1.0–2.5)
ALT: 9 U/L (ref 6–29)
AST: 11 U/L (ref 10–35)
Albumin: 4.6 g/dL (ref 3.6–5.1)
Alkaline phosphatase (APISO): 42 U/L (ref 31–125)
BUN: 16 mg/dL (ref 7–25)
CO2: 28 mmol/L (ref 20–32)
Calcium: 9.3 mg/dL (ref 8.6–10.2)
Chloride: 106 mmol/L (ref 98–110)
Creat: 0.77 mg/dL (ref 0.50–0.99)
Globulin: 2 g/dL (calc) (ref 1.9–3.7)
Glucose, Bld: 90 mg/dL (ref 65–99)
Potassium: 4.2 mmol/L (ref 3.5–5.3)
Sodium: 144 mmol/L (ref 135–146)
Total Bilirubin: 0.5 mg/dL (ref 0.2–1.2)
Total Protein: 6.6 g/dL (ref 6.1–8.1)

## 2022-11-24 LAB — CBC
HCT: 40.6 % (ref 35.0–45.0)
Hemoglobin: 13.2 g/dL (ref 11.7–15.5)
MCH: 30.1 pg (ref 27.0–33.0)
MCHC: 32.5 g/dL (ref 32.0–36.0)
MCV: 92.7 fL (ref 80.0–100.0)
RBC: 4.38 Million/uL (ref 3.80–5.10)
RDW: 13.2 % (ref 11.0–15.0)
WBC: 6.2 10*3/uL (ref 3.8–10.8)

## 2022-11-24 LAB — TSH: TSH: 4.29 mIU/L

## 2022-11-24 LAB — VITAMIN D 25 HYDROXY (VIT D DEFICIENCY, FRACTURES): Vit D, 25-Hydroxy: 22 ng/mL — ABNORMAL LOW (ref 30–100)

## 2022-11-24 LAB — T4, FREE: Free T4: 1.1 ng/dL (ref 0.8–1.8)

## 2022-11-24 LAB — VITAMIN B12: Vitamin B-12: 264 pg/mL (ref 200–1100)

## 2022-11-24 MED ORDER — VITAMIN D (ERGOCALCIFEROL) 1.25 MG (50000 UNIT) PO CAPS
50000.0000 [IU] | ORAL_CAPSULE | ORAL | 0 refills | Status: DC
Start: 2022-11-24 — End: 2023-04-04

## 2022-11-24 MED ORDER — VITAMIN B-12 1000 MCG PO TABS
1000.0000 ug | ORAL_TABLET | Freq: Every day | ORAL | 3 refills | Status: DC
Start: 2022-11-24 — End: 2023-11-08

## 2022-11-24 NOTE — Addendum Note (Signed)
Addended by: Monica Becton on: 11/24/2022 08:29 AM   Modules accepted: Orders

## 2022-11-24 NOTE — Assessment & Plan Note (Signed)
Vitamin D is low, B12 low normal, calling in supplement

## 2022-12-14 ENCOUNTER — Other Ambulatory Visit: Payer: Self-pay | Admitting: Sports Medicine

## 2022-12-14 DIAGNOSIS — Z0000 Encounter for general adult medical examination without abnormal findings: Secondary | ICD-10-CM

## 2022-12-24 ENCOUNTER — Ambulatory Visit (HOSPITAL_BASED_OUTPATIENT_CLINIC_OR_DEPARTMENT_OTHER): Payer: BC Managed Care – PPO | Admitting: Internal Medicine

## 2023-01-04 ENCOUNTER — Ambulatory Visit: Payer: BC Managed Care – PPO | Admitting: Sports Medicine

## 2023-01-21 ENCOUNTER — Ambulatory Visit
Admission: EM | Admit: 2023-01-21 | Discharge: 2023-01-21 | Disposition: A | Discharge status: Home / Self Care | Payer: BC Managed Care – PPO | Attending: Family Medicine | Admitting: Family Medicine

## 2023-01-21 ENCOUNTER — Other Ambulatory Visit: Payer: Self-pay

## 2023-01-21 ENCOUNTER — Ambulatory Visit (INDEPENDENT_AMBULATORY_CARE_PROVIDER_SITE_OTHER): Payer: BC Managed Care – PPO

## 2023-01-21 ENCOUNTER — Encounter: Payer: Self-pay | Admitting: Emergency Medicine

## 2023-01-21 ENCOUNTER — Ambulatory Visit: Payer: Self-pay

## 2023-01-21 DIAGNOSIS — S99912A Unspecified injury of left ankle, initial encounter: Secondary | ICD-10-CM | POA: Diagnosis not present

## 2023-01-21 DIAGNOSIS — M25472 Effusion, left ankle: Secondary | ICD-10-CM | POA: Diagnosis not present

## 2023-01-21 DIAGNOSIS — M25572 Pain in left ankle and joints of left foot: Secondary | ICD-10-CM

## 2023-01-21 MED ORDER — ACETAMINOPHEN 325 MG PO TABS
650.0000 mg | ORAL_TABLET | Freq: Once | ORAL | Status: AC
  Administered 2023-01-21: 650 mg via ORAL

## 2023-01-21 NOTE — ED Triage Notes (Signed)
Left ankle swelling and pain after missing a step earlier today at 1100 today  No pain meds for ankle  Pain with standing

## 2023-01-21 NOTE — ED Provider Notes (Signed)
[facility]    CSN: [id number] Arrival date & time: 01/21/23  1643      History   Chief Complaint Chief Complaint  Patient presents with   Ankle Pain    HPI Rita Bentley is a 52 y.o. female.   HPI Pleasant 52 year old female presents with left ankle pain and swelling after missing a step earlier today at her home roughly 11 AM.  Reports taking no medication for left ankle pain.  Patient reports pain with standing.  PMH significant for anxiety, sleep apnea, and history of nephrolithiasis.  Patient is accompanied by her son this evening.  Past Medical History:  Diagnosis Date   Anxiety    Arthritis    Complication of anesthesia    per patient had to be encourgaged multiple times to breathe deeply; ; likely low O2 saturation ; states " i think i was still just too drugged up    Depression    GERD (gastroesophageal reflux disease)    History of kidney stones    Sleep apnea    no CPAP use yet ; hasnt gotten it     Patient Active Problem List   Diagnosis Date Noted   Cervical spondylosis 11/22/2022   Cellulitis of external nose 06/01/2022   Benign skin lesion of nose 04/21/2022   Skin lesion of left arm 05/27/2021   Metatarsalgia of both feet 11/05/2020   Right lateral epicondylitis 12/14/2019   Patellofemoral syndrome, left 08/15/2018   Atypical chest pain 04/27/2018   GERD (gastroesophageal reflux disease) 11/21/2017   Insomnia 05/13/2017   Low back pain radiating to both legs 04/15/2017   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   Smoker 11/08/2016   Palpitations 08/16/2016   Obesity (BMI 30-39.9) 08/16/2016   Annual physical exam 07/19/2016   Anxiety and depression 07/19/2016   Surgical menopause 07/19/2016    Past Surgical History:  Procedure Laterality Date   CHOLECYSTECTOMY  09/10/1995   LAPAROSCOPIC GASTRIC SLEEVE RESECTION N/A 02/08/2017   Procedure: LAPAROSCOPIC GASTRIC SLEEVE RESECTION, UPPER ENDO;  Surgeon:  Luretha Murphy, MD;  Location: [facility];  Service: General;  Laterality: N/A;   TONSILLECTOMY     TOTAL VAGINAL HYSTERECTOMY  06/10/2011   TUBAL LIGATION  03/10/1999    OB History   No obstetric history on file.      Home Medications    Prior to Admission medications   Medication Sig Start Date End Date Taking? Authorizing Provider  ALPRAZolam Prudy Feeler) 0.5 MG tablet Take 1 tablet (0.5 mg total) by mouth 2 (two) times daily as needed for anxiety. 07/15/20   Monica Becton, MD  celecoxib (CELEBREX) 200 MG capsule One to 2 tablets by mouth daily as needed for pain. 11/22/22   Monica Becton, MD  cyanocobalamin (VITAMIN B12) 1000 MCG tablet Take 1 tablet (1,000 mcg total) by mouth daily. 11/24/22   Monica Becton, MD  mupirocin ointment (BACTROBAN) 2 % Apply to affected area TID for 7 days. 06/01/22   Monica Becton, MD  omeprazole (PRILOSEC) 40 MG capsule Take 1 capsule (40 mg total) by mouth 2 (two) times daily. Take in the morning and at dinnertime. 04/21/22   Monica Becton, MD  phentermine (ADIPEX-P) 37.5 MG tablet One tab by mouth qAM 05/19/22   Monica Becton, MD  rifampin (RIFADIN) 300 MG capsule Take 1 capsule (300 mg total) by mouth 2 (two) times daily. 06/07/22   Monica Becton, MD  traMADol (ULTRAM) 50 MG tablet  Take 1 tablet (50 mg total) by mouth 3 (three) times daily as needed. 06/19/21   Monica Becton, MD  traZODone (DESYREL) 50 MG tablet TAKE 1 TABLET BY MOUTH EVERYDAY AT BEDTIME 04/21/22   Monica Becton, MD  Vitamin D, Ergocalciferol, (DRISDOL) 1.25 MG (50000 UNIT) CAPS capsule Take 1 capsule (50,000 Units total) by mouth every 7 (seven) days. Take for 8 total doses(weeks) 11/24/22   Monica Becton, MD    Family History Family History  Problem Relation Age of Onset   Hyperlipidemia Mother    Hypertension Mother    Hyperlipidemia Father    Hypertension Father    Cancer Sister        Colon    Cancer Maternal Grandmother    Cancer Paternal Grandmother     Social History Social History   Tobacco Use   Smoking status: Every Day    Packs/day: 0.25    Years: 34.00    Additional pack years: 0.00    Total pack years: 8.50    Types: Cigarettes    Start date: 07/19/1990    Last attempt to quit: 11/25/2016    Years since quitting: 6.1   Smokeless tobacco: Never  Vaping Use   Vaping Use: Every day   Substances: Nicotine, Flavoring  Substance Use Topics   Alcohol use: Yes    Alcohol/week: 3.0 standard drinks of alcohol    Types: 3 Standard drinks or equivalent per week   Drug use: No     Allergies   Codeine and Escitalopram   Review of Systems Review of Systems  Musculoskeletal:        Left ankle pain secondary to left ankle injury  All other systems reviewed and are negative.    Physical Exam Triage Vital Signs ED Triage Vitals  Enc Vitals Group     BP 01/21/23 1653 117/82     Pulse Rate 01/21/23 1653 79     Resp 01/21/23 1653 16     Temp 01/21/23 1653 98.1 F (36.7 C)     Temp Source 01/21/23 1653 Oral     SpO2 01/21/23 1653 98 %     Weight 01/21/23 1656 156 lb (70.8 kg)     Height 01/21/23 1656 5\' 1"  (1.549 m)     Head Circumference --      Peak Flow --      Pain Score 01/21/23 1655 5     Pain Loc --      Pain Edu? --      Excl. in GC? --    No data found.  Updated Vital Signs BP 117/82 (BP Location: Left Arm)   Pulse 79   Temp 98.1 F (36.7 C) (Oral)   Resp 16   Ht 5\' 1"  (1.549 m)   Wt 156 lb (70.8 kg)   LMP 08/09/2010 (Approximate) Comment: full  SpO2 98%   BMI 29.48 kg/m      Physical Exam Vitals and nursing note reviewed.  Constitutional:      Appearance: Normal appearance. She is normal weight.  HENT:     Head: Normocephalic and atraumatic.     Mouth/Throat:     Mouth: Mucous membranes are moist.     Pharynx: Oropharynx is clear.  Eyes:     Extraocular Movements: Extraocular movements intact.     Conjunctiva/sclera:  Conjunctivae normal.     Pupils: Pupils are equal, round, and reactive to light.  Cardiovascular:     Rate and Rhythm: Normal rate  and regular rhythm.     Pulses: Normal pulses.     Heart sounds: Normal heart sounds.  Pulmonary:     Effort: Pulmonary effort is normal.     Breath sounds: Normal breath sounds. No wheezing, rhonchi or rales.  Musculoskeletal:        General: Normal range of motion.     Cervical back: Normal range of motion and neck supple.     Comments: Left ankle (lateral aspect over malleolus): TTP with mild soft tissue swelling noted, exam limited due to pain  Skin:    General: Skin is warm and dry.  Neurological:     General: No focal deficit present.     Mental Status: She is alert and oriented to person, place, and time. Mental status is at baseline.  Psychiatric:        Mood and Affect: Mood normal.        Behavior: Behavior normal.      UC Treatments / Results  Labs (all labs ordered are listed, but only abnormal results are displayed) Labs Reviewed - No data to display  EKG   Radiology DG Ankle Complete Left  Result Date: 01/21/2023 CLINICAL DATA:  Missed a step coming down the stairs this morning. Heard a pop. Pain and swelling to lateral ankle. EXAM: LEFT ANKLE COMPLETE - 3+ VIEW COMPARISON:  None Available. FINDINGS: Normal bone mineralization. The ankle mortise is symmetric and intact. Joint spaces are preserved. No acute fracture or dislocation. Mild lateral malleolar soft tissue swelling. Mild chronic enthesopathic change at the Achilles insertion on the calcaneus. IMPRESSION: Mild lateral malleolar soft tissue swelling. No acute fracture. Electronically Signed   By: Neita Garnet M.D.   On: 01/21/2023 17:26    Procedures Procedures (including critical care time)  Medications Ordered in UC Medications  acetaminophen (TYLENOL) tablet 650 mg (650 mg Oral Given 01/21/23 1704)    Initial Impression / Assessment and Plan / UC Course  I have  reviewed the triage vital signs and the nursing notes.  Pertinent labs & imaging results that were available during my care of the patient were reviewed by me and considered in my medical decision making (see chart for details).     MDM: 1.  Injury of left ankle, initial encounter-left ankle x-ray revealed above. Advised patient of left ankle x-ray results with hardcopy provided.  Advised patient to RICE affected area of left ankle for 30 minutes 3 times daily for the next 3 days.  Ace wrap placed on left ankle prior to discharge this evening.  2.  Left ankle pain, unspecified chronicity-left ankle x-ray revealed above advised patient may take previously prescribed Celebrex 200 mg capsule daily for the next 10 days.  Advised may take previously prescribed tramadol daily as needed for acute breakthrough left ankle pain.  Advised if symptoms worsen and/or unresolved please follow-up with your PCP in the next 7-to 10 days for further evaluation.  Patient discharged home, hemodynamically stable.  Final Clinical Impressions(s) / UC Diagnoses   Final diagnoses:  Left ankle pain, unspecified chronicity  Injury of left ankle, initial encounter     Discharge Instructions      Advised patient of left ankle x-ray results with hardcopy provided.  Advised patient to RICE affected area of left ankle for 30 minutes 3 times daily for the next 3 days.  Advised patient may take previously prescribed Celebrex 200 mg capsule daily for the next 10 days.  Advised may take previously prescribed tramadol daily  as needed for acute breakthrough left ankle pain.  Advised if symptoms worsen and/or unresolved after the next 7 to 10 days please follow-up with PCP/orthopedics for further evaluation.     ED Prescriptions   None    PDMP not reviewed this encounter.   Trevor Iha, FNP 01/21/23 1759

## 2023-01-21 NOTE — Discharge Instructions (Addendum)
Advised patient of left ankle x-ray results with hardcopy provided.  Advised patient to RICE affected area of left ankle for 30 minutes 3 times daily for the next 3 days.  Advised patient may take previously prescribed Celebrex 200 mg capsule daily for the next 10 days.  Advised may take previously prescribed tramadol daily as needed for acute breakthrough left ankle pain.  Advised if symptoms worsen and/or unresolved after the next 7 to 10 days please follow-up with PCP/orthopedics for further evaluation.

## 2023-01-27 ENCOUNTER — Other Ambulatory Visit: Payer: Self-pay | Admitting: Sports Medicine

## 2023-01-27 DIAGNOSIS — F32A Depression, unspecified: Secondary | ICD-10-CM

## 2023-01-28 MED ORDER — TRAMADOL HCL 50 MG PO TABS: 50.0000 mg | ORAL_TABLET | Freq: Three times a day (TID) | ORAL | 0 refills | Status: DC | PRN

## 2023-01-28 MED ORDER — ALPRAZOLAM 0.5 MG PO TABS
0.5000 mg | ORAL_TABLET | Freq: Two times a day (BID) | ORAL | 0 refills | Status: AC | PRN
Start: 2023-01-28 — End: ?

## 2023-01-28 NOTE — Telephone Encounter (Signed)
Alpraxolam - last written 07/15/2020 Tramadol = last written 06/19/2021 Last OV 11/22/2022 No upcoming appt schld.

## 2023-02-08 LAB — HM COLONOSCOPY

## 2023-02-18 ENCOUNTER — Other Ambulatory Visit: Payer: Self-pay | Admitting: Sports Medicine

## 2023-02-18 DIAGNOSIS — M545 Low back pain, unspecified: Secondary | ICD-10-CM

## 2023-03-18 ENCOUNTER — Encounter: Payer: Self-pay | Admitting: Sports Medicine

## 2023-03-28 ENCOUNTER — Telehealth: Payer: Self-pay

## 2023-03-28 NOTE — Transitions of Care (Post Inpatient/ED Visit) (Signed)
03/28/2023  Name: Rita Bentley MRN: [id number] DOB: October 12, 1970  Today's TOC FU Call Status: Today's TOC FU Call Status:: Successful TOC FU Call Completed TOC FU Call Complete Date: 03/28/23  Transition Care Management Follow-up Telephone Call Date of Discharge: 03/23/23 Discharge Facility: Other ([facility]) Name of Other ([facility]) [facility]charge Facility: [facility] Type of Discharge: Inpatient Admission Primary Inpatient Discharge Diagnosis:: stroke How have you been since you were released from the hospital?: Better Any questions or concerns?: No  Items Reviewed: Did you receive and understand the discharge instructions provided?: Yes Medications obtained,verified, and reconciled?: Yes (Medications Reviewed) Any new allergies since your discharge?: No Dietary orders reviewed?: Yes Do you have support at home?: No  Medications Reviewed Today: Medications Reviewed Today     Reviewed by Karena Addison, LPN (Licensed Practical Nurse) on 03/28/23 at 1200  Med List Status: <None>   Medication Order Taking? Sig Documenting Provider Last Dose Status Informant  ALPRAZolam (XANAX) 0.5 MG tablet [id number]  Take 1 tablet (0.5 mg total) by mouth 2 (two) times daily as needed for anxiety. Monica Becton, MD  Active   celecoxib (CELEBREX) 200 MG capsule [id number]  TAKE 1 TO 2 CAPSULES BY MOUTH DAILY AS NEEDED FOR PAIN. Monica Becton, MD  Active   cyanocobalamin (VITAMIN B12) 1000 MCG tablet [id number]  Take 1 tablet (1,000 mcg total) by mouth daily. Monica Becton, MD  Active   mupirocin ointment (BACTROBAN) 2 % [id number] No Apply to affected area TID for 7 days. Monica Becton, MD More than a month Active   omeprazole (PRILOSEC) 40 MG capsule [id number] No Take 1 capsule (40 mg total) by mouth 2 (two) times daily. Take in the morning and at dinnertime. Monica Becton, MD Taking Active   phentermine (ADIPEX-P) 37.5 MG  tablet [id number] No One tab by mouth qAM Monica Becton, MD Taking Active   rifampin (RIFADIN) 300 MG capsule [id number] No Take 1 capsule (300 mg total) by mouth 2 (two) times daily. Monica Becton, MD Taking Active   traMADol Janean Sark) 50 MG tablet [id number]  Take 1 tablet (50 mg total) by mouth 3 (three) times daily as needed. Monica Becton, MD  Active   traZODone (DESYREL) 50 MG tablet [id number] No TAKE 1 TABLET BY MOUTH EVERYDAY AT BEDTIME Monica Becton, MD Taking Active   Vitamin D, Ergocalciferol, (DRISDOL) 1.25 MG (50000 UNIT) CAPS capsule [id number]  Take 1 capsule (50,000 Units total) by mouth every 7 (seven) days. Take for 8 total doses(weeks) Monica Becton, MD  Active             Home Care and Equipment/Supplies: Were Home Health Services Ordered?: NA Any new equipment or medical supplies ordered?: NA  Functional Questionnaire: Do you need assistance with bathing/showering or dressing?: No Do you need assistance with meal preparation?: No Do you need assistance with eating?: No Do you have difficulty maintaining continence: No Do you need assistance with getting out of bed/getting out of a chair/moving?: No Do you have difficulty managing or taking your medications?: No  Follow up appointments reviewed: PCP Follow-up appointment confirmed?: Yes Date of PCP follow-up appointment?: 04/04/23 Follow[facility]up Provider: [facility] Follow-up appointment confirmed?: Yes Date of Specialist follow-up appointment?: 04/06/23 Follow-Up Specialty Provider:: Stroke Clinic Do you need transportation to your follow-up appointment?: No Do you understand care options if your condition(s) worsen?: Yes-patient verbalized understanding    SIGNATURE Karena Addison, LPN [facility] Nurse Health Advisor Direct Dial [phone]

## 2023-04-04 ENCOUNTER — Encounter: Payer: Self-pay | Admitting: Sports Medicine

## 2023-04-04 ENCOUNTER — Other Ambulatory Visit: Payer: Self-pay | Admitting: Sports Medicine

## 2023-04-04 ENCOUNTER — Ambulatory Visit: Payer: BC Managed Care – PPO | Attending: Sports Medicine

## 2023-04-04 ENCOUNTER — Ambulatory Visit: Payer: BC Managed Care – PPO | Admitting: Sports Medicine

## 2023-04-04 VITALS — BP 112/75 | HR 69 | Ht 61.0 in | Wt 162.0 lb

## 2023-04-04 DIAGNOSIS — G459 Transient cerebral ischemic attack, unspecified: Secondary | ICD-10-CM

## 2023-04-04 DIAGNOSIS — G43109 Migraine with aura, not intractable, without status migrainosus: Secondary | ICD-10-CM | POA: Diagnosis not present

## 2023-04-04 DIAGNOSIS — R2990 Unspecified symptoms and signs involving the nervous system: Secondary | ICD-10-CM

## 2023-04-04 DIAGNOSIS — G458 Other transient cerebral ischemic attacks and related syndromes: Secondary | ICD-10-CM

## 2023-04-04 MED ORDER — PREDNISONE 50 MG PO TABS
ORAL_TABLET | ORAL | 0 refills | Status: DC
Start: 2023-04-04 — End: 2023-05-02

## 2023-04-04 MED ORDER — NURTEC 75 MG PO TBDP
1.0000 | ORAL_TABLET | ORAL | 11 refills | Status: DC
Start: 2023-04-04 — End: 2024-04-19

## 2023-04-04 NOTE — Progress Notes (Signed)
    Procedures performed today:    None.  Independent interpretation of notes and tests performed by another provider:   None.  Brief History, Exam, Impression, and Recommendations:    Complicated migraine This is a very pleasant 52 year old female, she has a history of hyperlipidemia, lipids however have been typically well-controlled, LDL typically around 100, 732 in the ED. She does vape. She recalls an episode about 9 or 10 days ago of trying to reach to pick up an object and feeling numbness in her left hand, she describes the numbness as going down to the second and third fingers. She also then developed some slurred speech. She was concerned about a TIA and ultimately presented to the emergency department. CT angiography of the head and neck, brain MRI were all negative. Vital signs were stable and never high along the way. She was observed, she was discharged with follow-up with the stroke clinic and on Plavix. Her symptoms have resolved, today her neurologic exam is completely normal. 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 She does have a headache, and does get chronic headaches. I do suspect this was a migraine rather than a TIA. We will complete the workup with an echocardiogram as well as a Zio patch event monitoring to determine if there is any A-fib burden. We will also do 5 days of prednisone and I would like to add Nurtec. She will go ahead and discontinue her Plavix and she can follow-up with the stroke clinic simply for the evaluation. I would like to see her back in a month and we can discuss a migraine diary at the time.  For insurance coverage purposes she has a contraindication to topiramate and  triptans.    ____________________________________________ Ihor Austin. Benjamin Stain, M.D., ABFM., CAQSM., AME. Primary Care and Sports Medicine [facility] [facility]  Adjunct Professor of Family Medicine  [place] [employer] [facility] [employer]  Restaurant manager, fast food

## 2023-04-04 NOTE — Progress Notes (Unsigned)
Enrolled for [employer] to mail a ZIO XT long term holter monitor to the patients address on file.   DOD to read. 

## 2023-04-04 NOTE — Assessment & Plan Note (Signed)
This is a very pleasant 52 year old female, she has a history of hyperlipidemia, lipids however have been typically well-controlled, LDL typically around 100, 161 in the ED. She does vape. She recalls an episode about 9 or 10 days ago of trying to reach to pick up an object and feeling numbness in her left hand, she describes the numbness as going down to the second and third fingers. She also then developed some slurred speech. She was concerned about a TIA and ultimately presented to the emergency department. CT angiography of the head and neck, brain MRI were all negative. Vital signs were stable and never high along the way. She was observed, she was discharged with follow-up with the stroke clinic and on Plavix. Her symptoms have resolved, today her neurologic exam is completely normal. 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 She does have a headache, and does get chronic headaches. I do suspect this was a migraine rather than a TIA. We will complete the workup with an echocardiogram as well as a Zio patch event monitoring to determine if there is any A-fib burden. We will also do 5 days of prednisone and I would like to add Nurtec. She will go ahead and discontinue her Plavix and she can follow-up with the stroke clinic simply for the evaluation. I would like to see her back in a month and we can discuss a migraine diary at the time.  For insurance coverage purposes she has a contraindication to topiramate and triptans.

## 2023-04-06 ENCOUNTER — Encounter: Payer: Self-pay | Admitting: Sports Medicine

## 2023-04-06 DIAGNOSIS — G459 Transient cerebral ischemic attack, unspecified: Secondary | ICD-10-CM | POA: Diagnosis not present

## 2023-04-06 DIAGNOSIS — R2990 Unspecified symptoms and signs involving the nervous system: Secondary | ICD-10-CM | POA: Diagnosis not present

## 2023-04-06 DIAGNOSIS — G43109 Migraine with aura, not intractable, without status migrainosus: Secondary | ICD-10-CM

## 2023-04-21 ENCOUNTER — Other Ambulatory Visit: Payer: Self-pay | Admitting: Sports Medicine

## 2023-04-21 DIAGNOSIS — F32A Depression, unspecified: Secondary | ICD-10-CM

## 2023-04-27 ENCOUNTER — Encounter: Payer: Self-pay | Admitting: Sports Medicine

## 2023-04-28 ENCOUNTER — Other Ambulatory Visit: Payer: Self-pay | Admitting: Sports Medicine

## 2023-04-28 DIAGNOSIS — G43109 Migraine with aura, not intractable, without status migrainosus: Secondary | ICD-10-CM

## 2023-04-28 DIAGNOSIS — G459 Transient cerebral ischemic attack, unspecified: Secondary | ICD-10-CM

## 2023-05-02 ENCOUNTER — Ambulatory Visit: Payer: BC Managed Care – PPO | Admitting: Sports Medicine

## 2023-05-02 ENCOUNTER — Encounter: Payer: Self-pay | Admitting: Sports Medicine

## 2023-05-02 VITALS — BP 96/65 | HR 78 | Ht 61.0 in | Wt 167.0 lb

## 2023-05-02 DIAGNOSIS — M545 Low back pain, unspecified: Secondary | ICD-10-CM | POA: Diagnosis not present

## 2023-05-02 DIAGNOSIS — M79604 Pain in right leg: Secondary | ICD-10-CM

## 2023-05-02 DIAGNOSIS — E782 Mixed hyperlipidemia: Secondary | ICD-10-CM | POA: Insufficient documentation

## 2023-05-02 DIAGNOSIS — M79605 Pain in left leg: Secondary | ICD-10-CM

## 2023-05-02 DIAGNOSIS — Z0000 Encounter for general adult medical examination without abnormal findings: Secondary | ICD-10-CM

## 2023-05-02 DIAGNOSIS — F17200 Nicotine dependence, unspecified, uncomplicated: Secondary | ICD-10-CM

## 2023-05-02 DIAGNOSIS — G43109 Migraine with aura, not intractable, without status migrainosus: Secondary | ICD-10-CM | POA: Diagnosis not present

## 2023-05-02 MED ORDER — ATORVASTATIN CALCIUM 80 MG PO TABS: 80.0000 mg | ORAL_TABLET | Freq: Every day | ORAL | 3 refills | Status: DC

## 2023-05-02 MED ORDER — NICOTINE 7 MG/24HR TD PT24
7.0000 mg | MEDICATED_PATCH | Freq: Every day | TRANSDERMAL | 11 refills | Status: AC
Start: 2023-05-02 — End: ?

## 2023-05-02 MED ORDER — METHOCARBAMOL 500 MG PO TABS
500.0000 mg | ORAL_TABLET | Freq: Three times a day (TID) | ORAL | 0 refills | Status: DC | PRN
Start: 2023-05-02 — End: 2023-11-07

## 2023-05-02 NOTE — Addendum Note (Signed)
Addended by: Monica Becton on: 05/02/2023 09:50 AM   Modules accepted: Orders

## 2023-05-02 NOTE — Assessment & Plan Note (Signed)
Continue Lipitor, refilling. Lipids were last checked a month ago at an outside system and they were well-controlled.

## 2023-05-02 NOTE — Assessment & Plan Note (Signed)
Mild lumbar DDD, increasing pain, continue analgesics, adding Robaxin and home PT. Return to see me as needed for this.

## 2023-05-02 NOTE — Progress Notes (Addendum)
    Procedures performed today:    None.  Independent interpretation of notes and tests performed by another provider:   None.  Brief History, Exam, Impression, and Recommendations:    Complicated migraine Please see prior notes for further details, workup has been unrevealing, she has touched base with neurology, currently doing some supplements for migraine prevention and Nurtec as needed and this tends to control her headaches very well.  Low back pain radiating to both legs Mild lumbar DDD, increasing pain, continue analgesics, adding Robaxin and home PT. Return to see me as needed for this.  Smoker Vapes and does 1 to 2 cigarettes/day. Chantix was intolerable due to nightmares. Vaping does tend to decrease her desire for cigarettes so it sounds like nicotine replacement would be successful, we will start low-dose NicoDerm patches.  Hyperlipidemia, mixed Continue Lipitor, refilling. Lipids were last checked a month ago at an outside system and they were well-controlled.  Annual physical exam Can return in 6 to 8 months for fasting annual physical, I will go ahead and order her mammogram.    ____________________________________________ Ihor Austin. Benjamin Stain, M.D., ABFM., CAQSM., AME. Primary Care and Sports Medicine  [facility]  Adjunct Professor of Family Medicine  [place] [employer] [facility] [employer]  Restaurant manager, fast food

## 2023-05-02 NOTE — Assessment & Plan Note (Signed)
Can return in 6 to 8 months for fasting annual physical, I will go ahead and order her mammogram.

## 2023-05-02 NOTE — Assessment & Plan Note (Signed)
Please see prior notes for further details, workup has been unrevealing, she has touched base with neurology, currently doing some supplements for migraine prevention and Nurtec as needed and this tends to control her headaches very well.

## 2023-05-02 NOTE — Assessment & Plan Note (Signed)
Vapes and does 1 to 2 cigarettes/day. Chantix was intolerable due to nightmares. Vaping does tend to decrease her desire for cigarettes so it sounds like nicotine replacement would be successful, we will start low-dose NicoDerm patches.

## 2023-05-20 ENCOUNTER — Other Ambulatory Visit: Payer: Self-pay | Admitting: Sports Medicine

## 2023-05-20 DIAGNOSIS — K219 Gastro-esophageal reflux disease without esophagitis: Secondary | ICD-10-CM

## 2023-05-23 ENCOUNTER — Ambulatory Visit (HOSPITAL_BASED_OUTPATIENT_CLINIC_OR_DEPARTMENT_OTHER)
Admission: RE | Admit: 2023-05-23 | Discharge: 2023-05-23 | Disposition: A | Discharge status: Home / Self Care | Payer: BC Managed Care – PPO | Source: Ambulatory Visit | Attending: Sports Medicine | Admitting: Sports Medicine

## 2023-05-23 DIAGNOSIS — G459 Transient cerebral ischemic attack, unspecified: Secondary | ICD-10-CM | POA: Diagnosis not present

## 2023-05-24 LAB — ECHOCARDIOGRAM COMPLETE
AR max vel: 1.89 cm2
AV Area VTI: 2.01 cm2
AV Area mean vel: 1.94 cm2
AV Mean grad: 4 mm[Hg]
AV Peak grad: 8 mm[Hg]
Ao pk vel: 1.41 m/s
Area-P 1/2: 4.86 cm2
Calc EF: 63.8 %
S' Lateral: 2.9 cm
Single Plane A2C EF: 59 %
Single Plane A4C EF: 68.3 %

## 2023-05-26 ENCOUNTER — Ambulatory Visit: Payer: BC Managed Care – PPO

## 2023-05-26 DIAGNOSIS — Z0000 Encounter for general adult medical examination without abnormal findings: Secondary | ICD-10-CM

## 2023-05-26 DIAGNOSIS — Z1231 Encounter for screening mammogram for malignant neoplasm of breast: Secondary | ICD-10-CM

## 2023-06-15 ENCOUNTER — Encounter: Payer: Self-pay | Admitting: Sports Medicine

## 2023-06-23 ENCOUNTER — Telehealth: Payer: BC Managed Care – PPO | Admitting: Sports Medicine

## 2023-06-30 ENCOUNTER — Telehealth: Payer: BC Managed Care – PPO | Admitting: Sports Medicine

## 2023-06-30 ENCOUNTER — Encounter: Payer: Self-pay | Admitting: Sports Medicine

## 2023-06-30 DIAGNOSIS — F419 Anxiety disorder, unspecified: Secondary | ICD-10-CM

## 2023-06-30 DIAGNOSIS — F32A Depression, unspecified: Secondary | ICD-10-CM

## 2023-06-30 NOTE — Telephone Encounter (Signed)
Patient states she was on the call for over 40 minutes, I was on it from 1:49 until 2:13.  This sounds like it was a flaw in the actual system.  [name] please see if you can contact IT and have them look into this.

## 2023-06-30 NOTE — Progress Notes (Signed)
   I logged into the call at 1:49 PM, we also sent the link for the video visit a couple of times, the patient never joined the virtual room, I left a call at 2:13 PM, patient will need to reschedule because she did not join the appointment room.  ___________________________________________ Ihor Austin. Benjamin Stain, M.D., ABFM., CAQSM., AME. Primary Care and Sports Medicine [place] [facility]  Adjunct Instructor of Family Medicine  [place] [employer] [facility] [employer]  Restaurant manager, fast food

## 2023-07-01 ENCOUNTER — Ambulatory Visit (INDEPENDENT_AMBULATORY_CARE_PROVIDER_SITE_OTHER): Payer: BC Managed Care – PPO | Admitting: Sports Medicine

## 2023-07-01 ENCOUNTER — Encounter: Payer: Self-pay | Admitting: Sports Medicine

## 2023-07-01 VITALS — BP 116/73 | HR 100

## 2023-07-01 DIAGNOSIS — F32A Depression, unspecified: Secondary | ICD-10-CM

## 2023-07-01 DIAGNOSIS — F419 Anxiety disorder, unspecified: Secondary | ICD-10-CM

## 2023-07-01 MED ORDER — DULOXETINE HCL 30 MG PO CPEP
30.0000 mg | ORAL_CAPSULE | Freq: Every day | ORAL | 3 refills | Status: DC
Start: 2023-07-01 — End: 2023-08-12

## 2023-07-01 NOTE — Progress Notes (Signed)
    Procedures performed today:    None.  Independent interpretation of notes and tests performed by another provider:   None.  Brief History, Exam, Impression, and Recommendations:    Anxiety and depression Very pleasant 52 year old female, she would initially presented with difficulty with memory. She does have a history of anxiety and depression and did not respond to Wellbutrin, Lexapro, we did start Cymbalta but never got follow-up. Ultimately her formal neuropsychological evaluation revealed more anxiety and depressive symptoms and did not reveal attention deficit disorder. I explained to her how anxiety and depression can present with what is called pseudodementia. The poor cognition typically resolves with treatment of the underlying depression, we will start Cymbalta and behavioral therapy, and like to see her back in 6 weeks for dose titration if needed.    ____________________________________________ Ihor Austin. Benjamin Stain, M.D., ABFM., CAQSM., AME. Primary Care and Sports Medicine [place] [facility]  Adjunct Professor of Family Medicine  [place] [employer] [facility] [employer] [facility] [employer]  Restaurant manager, fast food

## 2023-07-01 NOTE — Assessment & Plan Note (Signed)
Very pleasant 52 year old female, she would initially presented with difficulty with memory. She does have a history of anxiety and depression and did not respond to Wellbutrin, Lexapro, we did start Cymbalta but never got follow-up. Ultimately her formal neuropsychological evaluation revealed more anxiety and depressive symptoms and did not reveal attention deficit disorder. I explained to her how anxiety and depression can present with what is called pseudodementia. The poor cognition typically resolves with treatment of the underlying depression, we will start Cymbalta and behavioral therapy, and like to see her back in 6 weeks for dose titration if needed.

## 2023-07-13 ENCOUNTER — Encounter: Payer: Self-pay | Admitting: Sports Medicine

## 2023-08-12 ENCOUNTER — Ambulatory Visit (INDEPENDENT_AMBULATORY_CARE_PROVIDER_SITE_OTHER): Payer: BC Managed Care – PPO | Admitting: Sports Medicine

## 2023-08-12 VITALS — BP 102/68 | HR 66 | Ht 61.0 in | Wt 168.0 lb

## 2023-08-12 DIAGNOSIS — F419 Anxiety disorder, unspecified: Secondary | ICD-10-CM | POA: Diagnosis not present

## 2023-08-12 DIAGNOSIS — F32A Depression, unspecified: Secondary | ICD-10-CM

## 2023-08-12 MED ORDER — SERTRALINE HCL 50 MG PO TABS
ORAL_TABLET | ORAL | 3 refills | Status: DC
Start: 2023-08-12 — End: 2023-11-03

## 2023-08-12 NOTE — Progress Notes (Signed)
    Procedures performed today:    None.  Independent interpretation of notes and tests performed by another provider:   None.  Brief History, Exam, Impression, and Recommendations:    Anxiety and depression This very 53 year old female returns, initially she presented with difficulty with memory, she does have a history of anxiety depression and has not responded to Wellbutrin , Lexapro  in the past, we started Cymbalta , she has tried and now for 6 weeks and unfortunately is not noticing any improvement and is noticing worsening insomnia. Not many depressive symptoms currently, some may be getting some improvement with Cymbalta  however due to persistent anxiety symptoms and the insomnia we will switch her to Zoloft . Return in 6 weeks.  Chronic process not at goal with pharmacologic intervention  ____________________________________________ Debby PARAS. Curtis, M.D., ABFM., CAQSM., AME. Primary Care and Sports Medicine [facility]  Adjunct Professor of [facility] Medicine  [employer]  Restaurant Manager, [employer]

## 2023-08-12 NOTE — Assessment & Plan Note (Signed)
 This very 53 year old female returns, initially she presented with difficulty with memory, she does have a history of anxiety depression and has not responded to Wellbutrin , Lexapro  in the past, we started Cymbalta , she has tried and now for 6 weeks and unfortunately is not noticing any improvement and is noticing worsening insomnia. Not many depressive symptoms currently, some may be getting some improvement with Cymbalta  however due to persistent anxiety symptoms and the insomnia we will switch her to Zoloft . Return in 6 weeks.

## 2023-08-18 ENCOUNTER — Other Ambulatory Visit: Payer: Self-pay | Admitting: Sports Medicine

## 2023-08-18 MED ORDER — TRAMADOL HCL 50 MG PO TABS: 50.0000 mg | ORAL_TABLET | Freq: Three times a day (TID) | ORAL | 3 refills | Status: AC | PRN

## 2023-08-18 NOTE — Addendum Note (Signed)
 Addended by: Monica Becton on: 08/18/2023 05:15 PM   Modules accepted: Orders

## 2023-09-18 ENCOUNTER — Ambulatory Visit
Admission: RE | Admit: 2023-09-18 | Discharge: 2023-09-18 | Disposition: A | Discharge status: Home / Self Care | Payer: BC Managed Care – PPO | Source: Ambulatory Visit | Attending: Family Medicine | Admitting: Family Medicine

## 2023-09-18 ENCOUNTER — Other Ambulatory Visit: Payer: Self-pay

## 2023-09-18 VITALS — BP 115/80 | HR 68 | Temp 98.4°F | Resp 18 | Ht 62.0 in | Wt 165.0 lb

## 2023-09-18 DIAGNOSIS — J3489 Other specified disorders of nose and nasal sinuses: Secondary | ICD-10-CM | POA: Diagnosis not present

## 2023-09-18 DIAGNOSIS — J0100 Acute maxillary sinusitis, unspecified: Secondary | ICD-10-CM

## 2023-09-18 MED ORDER — AMOXICILLIN-POT CLAVULANATE 875-125 MG PO TABS: 1.0000 | ORAL_TABLET | Freq: Two times a day (BID) | ORAL | 0 refills | Status: DC

## 2023-09-18 MED ORDER — PREDNISONE 20 MG PO TABS: ORAL_TABLET | ORAL | 0 refills | Status: DC

## 2023-09-18 NOTE — ED Triage Notes (Signed)
 Pt states that she has a headache, cough, nasal congestion, and facial pain. X6 days Pt states that she also has some fatigue.

## 2023-09-18 NOTE — ED Provider Notes (Signed)
 [facility]    CSN: [id number] Arrival date & time: 09/18/23  1041      History   Chief Complaint Chief Complaint  Patient presents with   Facial Pain    I think I have a sinus infection and Thursday started sore throat. That has stopped just severe headache since pain, runny nose, sneezing and cough - Entered by patient    HPI Shanna Un is a 53 y.o. female.   HPI 53 year old female presents with headache, nasal congestion, and facial pain with cough for 6 days.  Patient reports that she believes she may have a sinus infection.  PMH significant for current daily cigarette smoker, sleep apnea, and obesity.  Past Medical History:  Diagnosis Date   Anxiety    Arthritis    Complication of anesthesia    per patient had to be encourgaged multiple times to breathe deeply; ; likely low O2 saturation ; states  i think i was still just too drugged up    Depression    GERD (gastroesophageal reflux disease)    History of kidney stones    Sleep apnea    no CPAP use yet ; hasnt gotten it     Patient Active Problem List   Diagnosis Date Noted   Hyperlipidemia, mixed 05/02/2023   Complicated migraine 04/04/2023   Cervical spondylosis 11/22/2022   Benign skin lesion of nose 04/21/2022   Skin lesion of left arm 05/27/2021   Metatarsalgia of both feet 11/05/2020   Right lateral epicondylitis 12/14/2019   Patellofemoral syndrome, left 08/15/2018   Atypical chest pain 04/27/2018   GERD (gastroesophageal reflux disease) 11/21/2017   Insomnia 05/13/2017   Low back pain radiating to both legs 04/15/2017   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   Smoker 11/08/2016   Palpitations 08/16/2016   Obesity (BMI 30-39.9) 08/16/2016   Annual physical exam 07/19/2016   Anxiety and depression 07/19/2016   Surgical menopause 07/19/2016    Past Surgical History:  Procedure Laterality Date   CHOLECYSTECTOMY  09/10/1995   LAPAROSCOPIC GASTRIC  SLEEVE RESECTION N/A 02/08/2017   Procedure: LAPAROSCOPIC GASTRIC SLEEVE RESECTION, UPPER ENDO;  Surgeon: Gladis Cough, MD;  Location: [facility];  Service: General;  Laterality: N/A;   TONSILLECTOMY     TOTAL VAGINAL HYSTERECTOMY  06/10/2011   TUBAL LIGATION  03/10/1999    OB History   No obstetric history on file.      Home Medications    Prior to Admission medications   Medication Sig Start Date End Date Taking? Authorizing Provider  ALPRAZolam  (XANAX ) 0.5 MG tablet Take 1 tablet (0.5 mg total) by mouth 2 (two) times daily as needed for anxiety. 01/28/23  Yes Curtis Debby PARAS, MD  amoxicillin -clavulanate (AUGMENTIN ) 875-125 MG tablet Take 1 tablet by mouth every 12 (twelve) hours. 09/18/23  Yes Teddy Sharper, FNP  aspirin EC 81 MG tablet Take 81 mg by mouth daily. 03/26/23 03/25/24 Yes [provider]  atorvastatin  (LIPITOR) 80 MG tablet Take 1 tablet (80 mg total) by mouth daily. 05/02/23  Yes Curtis Debby PARAS, MD  cyanocobalamin (VITAMIN B12) 1000 MCG tablet Take 1 tablet (1,000 mcg total) by mouth daily. 11/24/22  Yes Curtis Debby PARAS, MD  methocarbamol  (ROBAXIN ) 500 MG tablet Take 1 tablet (500 mg total) by mouth every 8 (eight) hours as needed for muscle spasms. 05/02/23  Yes Curtis Debby PARAS, MD  mupirocin  ointment (BACTROBAN ) 2 % Apply to affected area TID for 7 days. 06/01/22  Yes Thekkekandam,  Debby PARAS, MD  nicotine  (NICODERM CQ ) 7 mg/24hr patch Place 1 patch (7 mg total) onto the skin daily. 05/02/23  Yes Curtis Debby PARAS, MD  omeprazole  (PRILOSEC) 40 MG capsule TAKE 1 CAPSULE (40 MG TOTAL) BY MOUTH 2 (TWO) TIMES DAILY. TAKE IN THE MORNING AND AT DINNERTIME. 05/20/23  Yes Curtis Debby PARAS, MD  predniSONE  (DELTASONE ) 20 MG tablet Take 3 tabs PO daily x 5 days. 09/18/23  Yes Teddy Sharper, FNP  Rimegepant Sulfate (NURTEC) 75 MG TBDP Take 1 tablet (75 mg total) by mouth every other day. 04/04/23  Yes Curtis Debby PARAS, MD  sertraline  (ZOLOFT )  50 MG tablet 25 mg for a week and then 50 mg daily 08/12/23  Yes Curtis Debby PARAS, MD  traMADol  (ULTRAM ) 50 MG tablet Take 1 tablet (50 mg total) by mouth 3 (three) times daily as needed. 08/18/23  Yes Curtis Debby PARAS, MD  traZODone  (DESYREL ) 50 MG tablet TAKE 1 TABLET BY MOUTH EVERYDAY AT BEDTIME 04/21/23  Yes Curtis Debby PARAS, MD    Family History Family History  Problem Relation Age of Onset   Hyperlipidemia Mother    Hypertension Mother    Hyperlipidemia Father    Hypertension Father    Cancer Sister        Colon   Cancer Maternal Grandmother    Cancer Paternal Grandmother     Social History Social History   Tobacco Use   Smoking status: Every Day    Current packs/day: 0.00    Average packs/day: 0.3 packs/day for 34.0 years (8.5 ttl pk-yrs)    Types: Cigarettes    Start date: 07/19/1990    Last attempt to quit: 11/25/2016    Years since quitting: 6.8   Smokeless tobacco: Never  Vaping Use   Vaping status: Every Day   Substances: Nicotine , Flavoring  Substance Use Topics   Alcohol use: Not Currently    Alcohol/week: 3.0 standard drinks of alcohol    Types: 3 Standard drinks or equivalent per week   Drug use: No     Allergies   Codeine and Escitalopram    Review of Systems Review of Systems   Physical Exam Triage Vital Signs ED Triage Vitals  Encounter Vitals Group     BP 09/18/23 1114 115/80     Systolic BP Percentile --      Diastolic BP Percentile --      Pulse Rate 09/18/23 1114 68     Resp 09/18/23 1114 18     Temp 09/18/23 1114 98.4 F (36.9 C)     Temp Source 09/18/23 1114 Oral     SpO2 09/18/23 1114 99 %     Weight 09/18/23 1112 165 lb (74.8 kg)     Height 09/18/23 1112 5' 2 (1.575 m)     Head Circumference --      Peak Flow --      Pain Score 09/18/23 1112 8     Pain Loc --      Pain Education --      Exclude from Growth Chart --    No data found.  Updated Vital Signs BP 115/80 (BP Location: Left Arm)   Pulse 68    Temp 98.4 F (36.9 C) (Oral)   Resp 18   Ht 5' 2 (1.575 m)   Wt 165 lb (74.8 kg)   LMP 08/09/2010 (Approximate) Comment: full  SpO2 99%   BMI 30.18 kg/m   Visual Acuity Right Eye Distance:   Left Eye Distance:  Bilateral Distance:    Right Eye Near:   Left Eye Near:    Bilateral Near:     Physical Exam Vitals and nursing note reviewed.  Constitutional:      Appearance: Normal appearance. She is obese. She is ill-appearing.  HENT:     Head: Normocephalic and atraumatic.     Right Ear: Tympanic membrane and external ear normal.     Left Ear: Tympanic membrane and external ear normal.     Ears:     Comments: Significant eustachian tube dysfunction noted bilaterally    Nose:     Right Sinus: Frontal sinus tenderness present.     Comments: Turbinates are erythematous/edematous    Mouth/Throat:     Mouth: Mucous membranes are moist.     Pharynx: Oropharynx is clear.  Eyes:     Extraocular Movements: Extraocular movements intact.     Conjunctiva/sclera: Conjunctivae normal.     Pupils: Pupils are equal, round, and reactive to light.  Cardiovascular:     Rate and Rhythm: Normal rate and regular rhythm.     Pulses: Normal pulses.     Heart sounds: Normal heart sounds.  Pulmonary:     Effort: Pulmonary effort is normal.     Breath sounds: Normal breath sounds. No wheezing, rhonchi or rales.  Musculoskeletal:        General: Normal range of motion.     Cervical back: Normal range of motion and neck supple.  Skin:    General: Skin is warm and dry.  Neurological:     General: No focal deficit present.     Mental Status: She is alert and oriented to person, place, and time. Mental status is at baseline.      UC Treatments / Results  Labs (all labs ordered are listed, but only abnormal results are displayed) Labs Reviewed - No data to display  EKG   Radiology No results found.  Procedures Procedures (including critical care time)  Medications Ordered in  UC Medications - No data to display  Initial Impression / Assessment and Plan / UC Course  I have reviewed the triage vital signs and the nursing notes.  Pertinent labs & imaging results that were available during my care of the patient were reviewed by me and considered in my medical decision making (see chart for details).    MDM: 1.  Acute maxillary sinusitis, recurrence not specified-Rx'd Augmentin  875/125 mg tablet: Take 1 tablet twice daily x 7 days; 2.  Sinus pressure-Rx'd prednisone  20 mg tablet: Take 3 tabs p.o. daily x 5 days. Advised patient to take medications as directed with food to completion.  Advised patient to take prednisone  with first dose of Augmentin  for the next 5 of 7 days.  Encouraged to increase daily water intake to 64 ounces per day while taking these medications.  Advised if symptoms worsen and/or unresolved please follow-up with PCP or here for further evaluation.  Final Clinical Impressions(s) / UC Diagnoses   Final diagnoses:  Acute maxillary sinusitis, recurrence not specified  Sinus pressure     Discharge Instructions      Advised patient to take medications as directed with food to completion.  Advised patient to take prednisone  with first dose of Augmentin  for the next 5 of 7 days.  Encouraged to increase daily water intake to 64 ounces per day while taking these medications.  Advised if symptoms worsen and/or unresolved please follow-up with PCP or here for further evaluation.  ED Prescriptions     Medication Sig Dispense Auth. Provider   amoxicillin -clavulanate (AUGMENTIN ) 875-125 MG tablet Take 1 tablet by mouth every 12 (twelve) hours. 14 tablet Sven Pinheiro, FNP   predniSONE  (DELTASONE ) 20 MG tablet Take 3 tabs PO daily x 5 days. 15 tablet Kelseigh Diver, FNP      PDMP not reviewed this encounter.   Teddy Sharper, FNP 09/18/23 1135

## 2023-09-18 NOTE — Discharge Instructions (Addendum)
 Advised patient to take medications as directed with food to completion.  Advised patient to take prednisone with first dose of Augmentin for the next 5 of 7 days.  Encouraged to increase daily water intake to 64 ounces per day while taking these medications.  Advised if symptoms worsen and/or unresolved please follow-up with PCP or here for further evaluation.

## 2023-09-26 ENCOUNTER — Encounter: Payer: Self-pay | Admitting: Sports Medicine

## 2023-09-26 NOTE — Telephone Encounter (Signed)
 I do not see that she had testing for COVID or flu, this would definitely cause a headache resistant to antibiotics, it was probably the steroids that made her feel better not the antibiotics.  She probably needs to be seen here for testing.

## 2023-09-27 ENCOUNTER — Ambulatory Visit: Payer: BC Managed Care – PPO | Admitting: Sports Medicine

## 2023-09-27 NOTE — Telephone Encounter (Signed)
 Patient scheduled.

## 2023-09-28 ENCOUNTER — Ambulatory Visit: Payer: BC Managed Care – PPO | Admitting: Sports Medicine

## 2023-10-08 ENCOUNTER — Telehealth: Payer: Self-pay

## 2023-10-08 NOTE — Telephone Encounter (Signed)
 Prior auth for: TRAMADOL Determination: APPROVED Auth #Dellie Catholic / [id number] Valid from: 10/08/23 - 04/05/24 Patient notified via MyChart

## 2023-10-11 ENCOUNTER — Ambulatory Visit: Payer: BC Managed Care – PPO | Admitting: Sports Medicine

## 2023-11-01 ENCOUNTER — Encounter: Payer: BC Managed Care – PPO | Admitting: Sports Medicine

## 2023-11-02 ENCOUNTER — Other Ambulatory Visit: Payer: Self-pay | Admitting: Sports Medicine

## 2023-11-02 DIAGNOSIS — F419 Anxiety disorder, unspecified: Secondary | ICD-10-CM

## 2023-11-07 ENCOUNTER — Encounter: Payer: Self-pay | Admitting: Sports Medicine

## 2023-11-07 ENCOUNTER — Ambulatory Visit (INDEPENDENT_AMBULATORY_CARE_PROVIDER_SITE_OTHER): Admitting: Sports Medicine

## 2023-11-07 VITALS — BP 97/60 | HR 66 | Resp 20 | Ht 62.0 in | Wt 169.1 lb

## 2023-11-07 DIAGNOSIS — F32A Depression, unspecified: Secondary | ICD-10-CM

## 2023-11-07 DIAGNOSIS — R739 Hyperglycemia, unspecified: Secondary | ICD-10-CM

## 2023-11-07 DIAGNOSIS — F419 Anxiety disorder, unspecified: Secondary | ICD-10-CM

## 2023-11-07 DIAGNOSIS — E782 Mixed hyperlipidemia: Secondary | ICD-10-CM

## 2023-11-07 DIAGNOSIS — E669 Obesity, unspecified: Secondary | ICD-10-CM

## 2023-11-07 DIAGNOSIS — Z0000 Encounter for general adult medical examination without abnormal findings: Secondary | ICD-10-CM | POA: Diagnosis not present

## 2023-11-07 DIAGNOSIS — Z9884 Bariatric surgery status: Secondary | ICD-10-CM

## 2023-11-07 DIAGNOSIS — E538 Deficiency of other specified B group vitamins: Secondary | ICD-10-CM

## 2023-11-07 MED ORDER — TIRZEPATIDE 10 MG/0.5ML ~~LOC~~ SOAJ
SUBCUTANEOUS | 11 refills | Status: AC
Start: 2023-11-07 — End: ?

## 2023-11-07 NOTE — Assessment & Plan Note (Signed)
 Status post sleeve gastrectomy back in 2019, she did really well, she does need some additional weight loss that she has plateaued now that is 2025, adding compounded tirzepatide.

## 2023-11-07 NOTE — Assessment & Plan Note (Signed)
 At the last visit we switched Rita Bentley from Cymbalta to Zoloft, she was having more anxiety symptoms. She had not responded to Wellbutrin or Lexapro in the past She is doing extremely well on 50 mg of Zoloft, anxiety is under control and she is happy with where things are right now.

## 2023-11-07 NOTE — Progress Notes (Signed)
 Subjective:    CC: Annual Physical Exam  HPI:  This patient is here for their annual physical  I reviewed the past medical history, family history, social history, surgical history, and allergies today and no changes were needed.  Please see the problem list section below in epic for further details.  Past Medical History: Past Medical History:  Diagnosis Date   Anxiety    Arthritis    Complication of anesthesia    per patient had to be encourgaged multiple times to breathe deeply; ; likely low O2 saturation ; states " i think i was still just too drugged up    Depression    GERD (gastroesophageal reflux disease)    History of kidney stones    Sleep apnea    no CPAP use yet ; hasnt gotten it    Past Surgical History: Past Surgical History:  Procedure Laterality Date   CHOLECYSTECTOMY  09/10/1995   LAPAROSCOPIC GASTRIC SLEEVE RESECTION N/A 02/08/2017   Procedure: LAPAROSCOPIC GASTRIC SLEEVE RESECTION, UPPER ENDO;  Surgeon: Luretha Murphy, MD;  Location: [facility];  Service: General;  Laterality: N/A;   TONSILLECTOMY     TOTAL VAGINAL HYSTERECTOMY  06/10/2011   TUBAL LIGATION  03/10/1999   Social History: Social History   Socioeconomic History   Marital status: Married    Spouse name: Not on file   Number of children: Not on file   Years of education: Not on file   Highest education level: Not on file  Occupational History   Not on file  Tobacco Use   Smoking status: Every Day    Current packs/day: 0.00    Average packs/day: 0.3 packs/day for 34.0 years (8.5 ttl pk-yrs)    Types: Cigarettes    Start date: 07/19/1990    Last attempt to quit: 11/25/2016    Years since quitting: 6.9   Smokeless tobacco: Never  Vaping Use   Vaping status: Every Day   Substances: Nicotine, Flavoring  Substance and Sexual Activity   Alcohol use: Not Currently    Alcohol/week: 3.0 standard drinks of alcohol    Types: 3 Standard drinks or equivalent per week   Drug use: No   Sexual  activity: Yes    Partners: Female    Birth control/protection: Other-see comments  Other Topics Concern   Not on file  Social History Narrative   Not on file   Social Drivers of Health   Financial Resource Strain: Patient Declined (06/13/2023)   Received from [facility]   Overall Financial Resource Strain (CARDIA)    Difficulty of Paying Living Expenses: Patient declined  Food Insecurity: No Food Insecurity (06/13/2023)   Received from [facility]   Hunger Vital Sign    Worried About Running Out of Food in the Last Year: Never true    Ran Out of Food in the Last Year: Never true  Transportation Needs: No Transportation Needs (06/13/2023)   Received from [facility] - Transportation    Lack of Transportation (Medical): No    Lack of Transportation (Non-Medical): No  Physical Activity: Insufficiently Active (06/13/2023)   Received from [facility]   Exercise Vital Sign    Days of Exercise per Week: 2 days    Minutes of Exercise per Session: 30 min  Stress: Stress Concern Present (06/13/2023)   Received from [facility] - Occupational Stress Questionnaire    Feeling of Stress : To some extent  Social Connections: Socially  Isolated (06/13/2023)   Received from [facility]   Social Network    How would you rate your social network (family, work, friends)?: Little participation, lonely and socially isolated   Family History: Family History  Problem Relation Age of Onset   Hyperlipidemia Mother    Hypertension Mother    Hyperlipidemia Father    Hypertension Father    Cancer Sister        Colon   Cancer Maternal Grandmother    Cancer Paternal Grandmother    Allergies: Allergies  Allergen Reactions   Codeine Nausea And Vomiting   Escitalopram Diarrhea   Medications: See med rec.  Review of Systems: No headache, visual changes, nausea, vomiting, diarrhea, constipation, dizziness, abdominal pain, skin rash,  fevers, chills, night sweats, swollen lymph nodes, weight loss, chest pain, body aches, joint swelling, muscle aches, shortness of breath, mood changes, visual or auditory hallucinations.  Objective:    General: Well Developed, well nourished, and in no acute distress.  Neuro: Alert and oriented x3, extra-ocular muscles intact, sensation grossly intact. Cranial nerves II through XII are intact, motor, sensory, and coordinative functions are all intact. HEENT: Normocephalic, atraumatic, pupils equal round reactive to light, neck supple, no masses, no lymphadenopathy, thyroid nonpalpable. Oropharynx, nasopharynx, external ear canals are unremarkable. Skin: Warm and dry, no rashes noted.  Cardiac: Regular rate and rhythm, no murmurs rubs or gallops.  Respiratory: Clear to auscultation bilaterally. Not using accessory muscles, speaking in full sentences.  Abdominal: Soft, nontender, nondistended, positive bowel sounds, no masses, no organomegaly.  Musculoskeletal: Shoulder, elbow, wrist, hip, knee, ankle stable, and with full range of motion.  Impression and Recommendations:    The patient was counselled, risk factors were discussed, anticipatory guidance given.  Annual physical exam Fasting annual physical as above, declines Shingrix and pneumococcal vaccination.   Anxiety and depression At the last visit we switched Victorino Dike from Cymbalta to Zoloft, she was having more anxiety symptoms. She had not responded to Wellbutrin or Lexapro in the past She is doing extremely well on 50 mg of Zoloft, anxiety is under control and she is happy with where things are right now.  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  B12 deficiency Historically was doing injections, we switched her to oral cyanocobalamin, she took it  short-term and then stop. We will recheck her B12 levels, if low she will restart oral cyanocobalamin and we can recheck B12 levels 1 month later to evaluate if sufficient absorption.  Hyperlipidemia, mixed Still elevated on Lipitor, switching to high-dose Crestor   ____________________________________________ Ihor Austin. Benjamin Stain, M.D., ABFM., CAQSM., AME. Primary Care and Sports Medicine [facility]  Adjunct Professor of Family Medicine  [place] [employer] [facility] [employer] [facility] [employer]  Restaurant manager, fast food

## 2023-11-07 NOTE — Assessment & Plan Note (Signed)
 Historically was doing injections, we switched her to oral cyanocobalamin, she took it short-term and then stop. We will recheck her B12 levels, if low she will restart oral cyanocobalamin and we can recheck B12 levels 1 month later to evaluate if sufficient absorption.

## 2023-11-07 NOTE — Assessment & Plan Note (Signed)
 Fasting annual physical as above, declines Shingrix and pneumococcal vaccination.

## 2023-11-08 ENCOUNTER — Encounter: Payer: Self-pay | Admitting: Sports Medicine

## 2023-11-08 ENCOUNTER — Other Ambulatory Visit: Payer: Self-pay | Admitting: Sports Medicine

## 2023-11-08 DIAGNOSIS — E538 Deficiency of other specified B group vitamins: Secondary | ICD-10-CM

## 2023-11-08 DIAGNOSIS — Z0000 Encounter for general adult medical examination without abnormal findings: Secondary | ICD-10-CM

## 2023-11-08 LAB — COMPREHENSIVE METABOLIC PANEL WITH GFR
ALT: 11 IU/L (ref 0–32)
AST: 17 IU/L (ref 0–40)
Albumin: 4.4 g/dL (ref 3.9–4.9)
Alkaline Phosphatase: 48 IU/L (ref 44–121)
BUN/Creatinine Ratio: 19 (ref 9–23)
BUN: 14 mg/dL (ref 6–24)
Bilirubin Total: 0.3 mg/dL (ref 0.0–1.2)
CO2: 23 mmol/L (ref 20–29)
Calcium: 9.4 mg/dL (ref 8.7–10.2)
Chloride: 103 mmol/L (ref 96–106)
Creatinine, Ser: 0.73 mg/dL (ref 0.57–1.00)
Globulin, Total: 1.9 g/dL (ref 1.5–4.5)
Glucose: 90 mg/dL (ref 70–99)
Potassium: 4.5 mmol/L (ref 3.5–5.2)
Sodium: 140 mmol/L (ref 134–144)
Total Protein: 6.3 g/dL (ref 6.0–8.5)
eGFR: 100 mL/min/{1.73_m2} (ref >59)

## 2023-11-08 LAB — LIPID PANEL
Chol/HDL Ratio: 3.8 ratio (ref 0.0–4.4)
Cholesterol, Total: 208 mg/dL — ABNORMAL HIGH (ref 100–199)
HDL: 55 mg/dL (ref >39)
LDL Chol Calc (NIH): 134 mg/dL — ABNORMAL HIGH (ref 0–99)
Triglycerides: 109 mg/dL (ref 0–149)
VLDL Cholesterol Cal: 19 mg/dL (ref 5–40)

## 2023-11-08 LAB — CBC
Hematocrit: 40.7 % (ref 34.0–46.6)
Hemoglobin: 13.4 g/dL (ref 11.1–15.9)
MCH: 30.6 pg (ref 26.6–33.0)
MCHC: 32.9 g/dL (ref 31.5–35.7)
MCV: 93 fL (ref 79–97)
Platelets: 310 10*3/uL (ref 150–450)
RBC: 4.38 x10E6/uL (ref 3.77–5.28)
RDW: 13 % (ref 11.7–15.4)
WBC: 5.9 10*3/uL (ref 3.4–10.8)

## 2023-11-08 LAB — HEMOGLOBIN A1C
Est. average glucose Bld gHb Est-mCnc: 111 mg/dL
Hgb A1c MFr Bld: 5.5 % (ref 4.8–5.6)

## 2023-11-08 LAB — VITAMIN B12: Vitamin B-12: 328 pg/mL (ref 232–1245)

## 2023-11-08 LAB — TSH: TSH: 3.59 u[IU]/mL (ref 0.450–4.500)

## 2023-11-08 MED ORDER — VITAMIN B-12 1000 MCG PO TABS
1000.0000 ug | ORAL_TABLET | Freq: Every day | ORAL | 3 refills | Status: AC
Start: 2023-11-08 — End: ?

## 2023-11-08 MED ORDER — ROSUVASTATIN CALCIUM 40 MG PO TABS
40.0000 mg | ORAL_TABLET | Freq: Every day | ORAL | 3 refills | Status: AC
Start: 2023-11-08 — End: ?

## 2023-11-08 NOTE — Addendum Note (Signed)
 Addended by: Monica Becton on: 11/08/2023 10:20 AM   Modules accepted: Orders

## 2023-11-08 NOTE — Assessment & Plan Note (Signed)
 Still elevated on Lipitor, switching to high-dose Crestor

## 2023-11-24 ENCOUNTER — Encounter (INDEPENDENT_AMBULATORY_CARE_PROVIDER_SITE_OTHER): Payer: Self-pay | Admitting: Sports Medicine

## 2023-11-24 DIAGNOSIS — J329 Chronic sinusitis, unspecified: Secondary | ICD-10-CM

## 2023-11-24 MED ORDER — AMOXICILLIN-POT CLAVULANATE 875-125 MG PO TABS
1.0000 | ORAL_TABLET | Freq: Two times a day (BID) | ORAL | 0 refills | Status: AC
Start: 2023-11-24 — End: ?

## 2023-11-24 MED ORDER — AZELASTINE HCL 0.1 % NA SOLN
2.0000 | Freq: Two times a day (BID) | NASAL | 1 refills | Status: DC
Start: 2023-11-24 — End: 2023-12-19

## 2023-11-24 NOTE — Assessment & Plan Note (Signed)
 Facial and paranasal pressure and pain without purulent discharge, no second sickening, present for 2 days, advised nasal azelastine for the next 5 days and if persistence of symptoms can start the Augmentin.

## 2023-11-24 NOTE — Telephone Encounter (Signed)

## 2023-12-17 ENCOUNTER — Other Ambulatory Visit: Payer: Self-pay | Admitting: Sports Medicine

## 2023-12-17 DIAGNOSIS — J329 Chronic sinusitis, unspecified: Secondary | ICD-10-CM

## 2023-12-19 ENCOUNTER — Ambulatory Visit: Admitting: Sports Medicine

## 2023-12-19 VITALS — BP 91/61 | HR 74 | Wt 158.0 lb

## 2023-12-19 DIAGNOSIS — E669 Obesity, unspecified: Secondary | ICD-10-CM

## 2023-12-19 NOTE — Assessment & Plan Note (Signed)
 11 pound weight loss after the first month on tirzepatide, continue this, she likely does not need to go up on the dose, return in 6 months.

## 2023-12-19 NOTE — Progress Notes (Signed)
    Procedures performed today:    None.  Independent interpretation of notes and tests performed by another provider:   None.  Brief History, Exam, Impression, and Recommendations:    Obesity (BMI 30-39.9) 11 pound weight loss after the first month on tirzepatide, continue this, she likely does not need to go up on the dose, return in 6 months.    ____________________________________________ Joselyn Nicely. Sandy Crumb, M.D., ABFM., CAQSM., AME. Primary Care and Sports Medicine [facility]  Adjunct Professor of [facility] Medicine  [employer]  Restaurant manager, fast food

## 2024-04-10 ENCOUNTER — Encounter: Payer: Self-pay | Admitting: Sports Medicine

## 2024-04-18 ENCOUNTER — Telehealth: Payer: Self-pay | Admitting: Sports Medicine

## 2024-04-18 NOTE — Telephone Encounter (Signed)
 Patient called. She is requesting a refill on Nurtec and Omeprazole . She has a TOC appointment with [facility] on October 24. Pharmacy:  [facility]

## 2024-04-19 ENCOUNTER — Other Ambulatory Visit: Payer: Self-pay

## 2024-04-19 DIAGNOSIS — G43109 Migraine with aura, not intractable, without status migrainosus: Secondary | ICD-10-CM

## 2024-04-19 DIAGNOSIS — K219 Gastro-esophageal reflux disease without esophagitis: Secondary | ICD-10-CM

## 2024-04-19 MED ORDER — OMEPRAZOLE 40 MG PO CPDR: 40.0000 mg | DELAYED_RELEASE_CAPSULE | Freq: Two times a day (BID) | ORAL | 0 refills | Status: AC

## 2024-04-19 MED ORDER — NURTEC 75 MG PO TBDP: 1.0000 | ORAL_TABLET | ORAL | 0 refills | Status: AC

## 2024-04-19 NOTE — Telephone Encounter (Signed)
 Copied from CRM [id number]. Topic: Clinical - Medication Question >> Apr 18, 2024  1:24 PM Diannia H wrote: Reason for CRM: Patient is trying to get her medicine refilled before she leaves on Monday to go out of town. The medicines are omeprazole  (PRILOSEC) 40 MG capsule, and Rimegepant Sulfate (NURTEC) 75 MG TBDP. She said she has tried three times already through the pharmacy and they have tried to contact the provider 3 times for approval but its for Dr. ONEIDA. Could you assist? Patients callback number is [phone].

## 2024-04-19 NOTE — Telephone Encounter (Signed)
 Forwarding message to ZADA Palin , NP covering Dr. Curtis  Requesting rx rf of  Omeprazole  40mg   Last written 05/20/2023 And  Nurtec 75 Last written 04/04/2023 Last OV 12/19/2023 Dr. Yvonnie Upcoming appt 06/01/2024 TOC appt [facility]   O.k. to give 30 day of each until patient can est with [facility]?

## 2024-04-24 NOTE — Telephone Encounter (Signed)
 Sent to the pharmacy 04/19/2024 for a 30 day supply

## 2024-05-09 ENCOUNTER — Ambulatory Visit: Admitting: Urgent Care

## 2024-05-12 ENCOUNTER — Other Ambulatory Visit: Payer: Self-pay | Admitting: Medical-Surgical

## 2024-05-12 DIAGNOSIS — K219 Gastro-esophageal reflux disease without esophagitis: Secondary | ICD-10-CM

## 2024-05-14 ENCOUNTER — Other Ambulatory Visit: Payer: Self-pay

## 2024-05-14 DIAGNOSIS — K219 Gastro-esophageal reflux disease without esophagitis: Secondary | ICD-10-CM

## 2024-05-15 ENCOUNTER — Other Ambulatory Visit (HOSPITAL_BASED_OUTPATIENT_CLINIC_OR_DEPARTMENT_OTHER): Payer: Self-pay

## 2024-05-15 DIAGNOSIS — F419 Anxiety disorder, unspecified: Secondary | ICD-10-CM

## 2024-05-15 MED ORDER — TRAZODONE HCL 50 MG PO TABS
ORAL_TABLET | ORAL | 0 refills | Status: DC
Start: 2024-05-15 — End: 2024-06-05

## 2024-06-01 ENCOUNTER — Encounter: Admitting: Urgent Care

## 2024-06-05 ENCOUNTER — Other Ambulatory Visit (HOSPITAL_BASED_OUTPATIENT_CLINIC_OR_DEPARTMENT_OTHER): Payer: Self-pay | Admitting: Family Medicine

## 2024-06-05 DIAGNOSIS — F419 Anxiety disorder, unspecified: Secondary | ICD-10-CM

## 2024-06-20 ENCOUNTER — Ambulatory Visit: Admitting: Sports Medicine

## 2024-09-07 ENCOUNTER — Encounter (HOSPITAL_COMMUNITY): Payer: Self-pay | Admitting: *Deleted

## 2024-10-09 ENCOUNTER — Encounter: Admitting: Urgent Care
